# Patient Record
Sex: Female | Born: 1940 | Race: White | Hispanic: No | Marital: Married | Smoking: Never smoker
Health system: Southern US, Community
[De-identification: ages and names within clinical notes are randomized; demographics above are authoritative.]

## PROBLEM LIST (undated history)

## (undated) DIAGNOSIS — B029 Zoster without complications: Secondary | ICD-10-CM

## (undated) DIAGNOSIS — R1031 Right lower quadrant pain: Principal | ICD-10-CM

## (undated) DIAGNOSIS — D509 Iron deficiency anemia, unspecified: Secondary | ICD-10-CM

## (undated) DIAGNOSIS — D649 Anemia, unspecified: Secondary | ICD-10-CM

## (undated) DIAGNOSIS — F419 Anxiety disorder, unspecified: Secondary | ICD-10-CM

## (undated) DIAGNOSIS — G629 Polyneuropathy, unspecified: Secondary | ICD-10-CM

## (undated) DIAGNOSIS — M810 Age-related osteoporosis without current pathological fracture: Secondary | ICD-10-CM

## (undated) DIAGNOSIS — J40 Bronchitis, not specified as acute or chronic: Secondary | ICD-10-CM

## (undated) DIAGNOSIS — R269 Unspecified abnormalities of gait and mobility: Secondary | ICD-10-CM

## (undated) DIAGNOSIS — H8110 Benign paroxysmal vertigo, unspecified ear: Secondary | ICD-10-CM

## (undated) DIAGNOSIS — J189 Pneumonia, unspecified organism: Secondary | ICD-10-CM

## (undated) DIAGNOSIS — S92909A Unspecified fracture of unspecified foot, initial encounter for closed fracture: Secondary | ICD-10-CM

## (undated) DIAGNOSIS — I10 Essential (primary) hypertension: Secondary | ICD-10-CM

## (undated) DIAGNOSIS — K259 Gastric ulcer, unspecified as acute or chronic, without hemorrhage or perforation: Secondary | ICD-10-CM

## (undated) DIAGNOSIS — E785 Hyperlipidemia, unspecified: Secondary | ICD-10-CM

## (undated) DIAGNOSIS — G600 Hereditary motor and sensory neuropathy: Secondary | ICD-10-CM

## (undated) HISTORY — DX: Iron deficiency anemia, unspecified: D50.9

## (undated) HISTORY — DX: Age-related osteoporosis without current pathological fracture: M81.0

## (undated) HISTORY — DX: Anemia, unspecified: D64.9

## (undated) HISTORY — DX: Benign paroxysmal vertigo, unspecified ear: H81.10

## (undated) HISTORY — PX: BILATERAL OOPHORECTOMY: SHX1221

## (undated) HISTORY — PX: MASTOIDECTOMY: SHX711

## (undated) HISTORY — PX: CATARACT EXTRACTION: SUR2

## (undated) HISTORY — PX: OTHER SURGICAL HISTORY: SHX169

## (undated) HISTORY — DX: Bronchitis, not specified as acute or chronic: J40

## (undated) HISTORY — DX: Hereditary motor and sensory neuropathy: G60.0

## (undated) HISTORY — DX: Zoster without complications: B02.9

## (undated) HISTORY — DX: Right lower quadrant pain: R10.31

## (undated) HISTORY — DX: Unspecified abnormalities of gait and mobility: R26.9

## (undated) HISTORY — DX: Pneumonia, unspecified organism: J18.9

## (undated) HISTORY — DX: Polyneuropathy, unspecified: G62.9

## (undated) HISTORY — DX: Hyperlipidemia, unspecified: E78.5

## (undated) HISTORY — DX: Unspecified fracture of unspecified foot, initial encounter for closed fracture: S92.909A

## (undated) HISTORY — DX: Gastric ulcer, unspecified as acute or chronic, without hemorrhage or perforation: K25.9

## (undated) HISTORY — DX: Essential (primary) hypertension: I10

## (undated) HISTORY — PX: CHOLECYSTECTOMY: SHX55

## (undated) HISTORY — PX: COLONOSCOPY: SHX174

---

## 1977-07-13 HISTORY — PX: ABDOMINAL HYSTERECTOMY: SHX81

## 1994-07-13 HISTORY — PX: TOTAL ABDOMINAL HYSTERECTOMY W/ BILATERAL SALPINGOOPHORECTOMY: SHX83

## 1999-06-12 ENCOUNTER — Other Ambulatory Visit: Admission: RE | Admit: 1999-06-12 | Discharge: 1999-06-12 | Payer: Self-pay | Admitting: Obstetrics and Gynecology

## 2000-05-26 ENCOUNTER — Encounter: Admission: RE | Admit: 2000-05-26 | Discharge: 2000-05-26 | Payer: Self-pay | Admitting: Obstetrics and Gynecology

## 2000-05-26 ENCOUNTER — Encounter: Payer: Self-pay | Admitting: Obstetrics and Gynecology

## 2000-06-22 ENCOUNTER — Other Ambulatory Visit: Admission: RE | Admit: 2000-06-22 | Discharge: 2000-06-22 | Payer: Self-pay | Admitting: Obstetrics and Gynecology

## 2000-09-18 ENCOUNTER — Encounter: Payer: Self-pay | Admitting: Internal Medicine

## 2000-09-18 ENCOUNTER — Ambulatory Visit (HOSPITAL_COMMUNITY): Admission: RE | Admit: 2000-09-18 | Discharge: 2000-09-18 | Payer: Self-pay | Admitting: Internal Medicine

## 2001-04-28 ENCOUNTER — Encounter: Admission: RE | Admit: 2001-04-28 | Discharge: 2001-06-14 | Payer: Self-pay | Admitting: Orthopedic Surgery

## 2001-06-08 ENCOUNTER — Encounter: Payer: Self-pay | Admitting: Obstetrics and Gynecology

## 2001-06-08 ENCOUNTER — Encounter: Admission: RE | Admit: 2001-06-08 | Discharge: 2001-06-08 | Payer: Self-pay | Admitting: Obstetrics and Gynecology

## 2001-06-08 ENCOUNTER — Encounter: Payer: Self-pay | Admitting: Orthopedic Surgery

## 2001-08-09 ENCOUNTER — Other Ambulatory Visit: Admission: RE | Admit: 2001-08-09 | Discharge: 2001-08-09 | Payer: Self-pay | Admitting: Obstetrics and Gynecology

## 2001-09-14 ENCOUNTER — Encounter: Admission: RE | Admit: 2001-09-14 | Discharge: 2001-10-14 | Payer: Self-pay | Admitting: Internal Medicine

## 2002-01-18 ENCOUNTER — Ambulatory Visit (HOSPITAL_COMMUNITY): Admission: RE | Admit: 2002-01-18 | Discharge: 2002-01-18 | Payer: Self-pay | Admitting: Internal Medicine

## 2002-03-27 ENCOUNTER — Ambulatory Visit (HOSPITAL_COMMUNITY): Admission: RE | Admit: 2002-03-27 | Discharge: 2002-03-27 | Payer: Self-pay | Admitting: Obstetrics and Gynecology

## 2002-03-27 ENCOUNTER — Encounter: Payer: Self-pay | Admitting: Obstetrics and Gynecology

## 2002-04-06 ENCOUNTER — Ambulatory Visit (HOSPITAL_COMMUNITY): Admission: RE | Admit: 2002-04-06 | Discharge: 2002-04-06 | Payer: Self-pay | Admitting: Internal Medicine

## 2002-04-06 ENCOUNTER — Encounter: Payer: Self-pay | Admitting: Internal Medicine

## 2002-04-19 ENCOUNTER — Ambulatory Visit: Admission: RE | Admit: 2002-04-19 | Discharge: 2002-04-19 | Payer: Self-pay | Admitting: Gynecology

## 2002-05-02 ENCOUNTER — Encounter (INDEPENDENT_AMBULATORY_CARE_PROVIDER_SITE_OTHER): Payer: Self-pay | Admitting: Specialist

## 2002-05-02 ENCOUNTER — Ambulatory Visit (HOSPITAL_COMMUNITY): Admission: RE | Admit: 2002-05-02 | Discharge: 2002-05-02 | Payer: Self-pay | Admitting: Gynecology

## 2002-05-02 ENCOUNTER — Encounter: Payer: Self-pay | Admitting: Gynecology

## 2002-06-09 ENCOUNTER — Encounter: Admission: RE | Admit: 2002-06-09 | Discharge: 2002-06-09 | Payer: Self-pay | Admitting: Obstetrics and Gynecology

## 2002-06-09 ENCOUNTER — Encounter: Payer: Self-pay | Admitting: Obstetrics and Gynecology

## 2002-06-15 ENCOUNTER — Encounter: Payer: Self-pay | Admitting: Obstetrics and Gynecology

## 2002-06-15 ENCOUNTER — Encounter: Admission: RE | Admit: 2002-06-15 | Discharge: 2002-06-15 | Payer: Self-pay | Admitting: Obstetrics and Gynecology

## 2002-08-17 ENCOUNTER — Encounter: Payer: Self-pay | Admitting: Gynecology

## 2002-08-17 ENCOUNTER — Ambulatory Visit (HOSPITAL_COMMUNITY): Admission: RE | Admit: 2002-08-17 | Discharge: 2002-08-17 | Payer: Self-pay | Admitting: Gynecology

## 2002-08-22 ENCOUNTER — Ambulatory Visit: Admission: RE | Admit: 2002-08-22 | Discharge: 2002-08-22 | Payer: Self-pay | Admitting: Gynecology

## 2002-08-22 ENCOUNTER — Other Ambulatory Visit: Admission: RE | Admit: 2002-08-22 | Discharge: 2002-08-22 | Payer: Self-pay | Admitting: Gynecology

## 2002-10-19 ENCOUNTER — Ambulatory Visit (HOSPITAL_COMMUNITY): Admission: RE | Admit: 2002-10-19 | Discharge: 2002-10-19 | Payer: Self-pay | Admitting: Internal Medicine

## 2002-10-24 ENCOUNTER — Ambulatory Visit (HOSPITAL_COMMUNITY): Admission: RE | Admit: 2002-10-24 | Discharge: 2002-10-24 | Payer: Self-pay | Admitting: Internal Medicine

## 2002-11-13 ENCOUNTER — Encounter (HOSPITAL_COMMUNITY): Admission: RE | Admit: 2002-11-13 | Discharge: 2002-12-13 | Payer: Self-pay | Admitting: Oncology

## 2002-11-13 ENCOUNTER — Encounter: Admission: RE | Admit: 2002-11-13 | Discharge: 2002-11-13 | Payer: Self-pay | Admitting: Oncology

## 2002-12-06 ENCOUNTER — Encounter: Payer: Self-pay | Admitting: Internal Medicine

## 2002-12-06 ENCOUNTER — Ambulatory Visit (HOSPITAL_COMMUNITY): Admission: RE | Admit: 2002-12-06 | Discharge: 2002-12-06 | Payer: Self-pay | Admitting: Internal Medicine

## 2002-12-19 ENCOUNTER — Encounter: Payer: Self-pay | Admitting: Internal Medicine

## 2002-12-19 ENCOUNTER — Ambulatory Visit (HOSPITAL_COMMUNITY): Admission: RE | Admit: 2002-12-19 | Discharge: 2002-12-19 | Payer: Self-pay | Admitting: Internal Medicine

## 2002-12-27 ENCOUNTER — Encounter (HOSPITAL_COMMUNITY): Admission: RE | Admit: 2002-12-27 | Discharge: 2003-01-26 | Payer: Self-pay | Admitting: Oncology

## 2002-12-27 ENCOUNTER — Encounter: Admission: RE | Admit: 2002-12-27 | Discharge: 2002-12-27 | Payer: Self-pay | Admitting: Oncology

## 2003-01-09 ENCOUNTER — Ambulatory Visit (HOSPITAL_COMMUNITY): Admission: RE | Admit: 2003-01-09 | Discharge: 2003-01-09 | Payer: Self-pay | Admitting: Internal Medicine

## 2003-02-14 ENCOUNTER — Encounter (HOSPITAL_COMMUNITY): Admission: RE | Admit: 2003-02-14 | Discharge: 2003-03-16 | Payer: Self-pay | Admitting: Oncology

## 2003-02-14 ENCOUNTER — Encounter: Admission: RE | Admit: 2003-02-14 | Discharge: 2003-02-14 | Payer: Self-pay | Admitting: Oncology

## 2003-02-17 ENCOUNTER — Inpatient Hospital Stay (HOSPITAL_COMMUNITY): Admission: AD | Admit: 2003-02-17 | Discharge: 2003-02-18 | Payer: Self-pay | Admitting: Neurology

## 2003-02-17 ENCOUNTER — Encounter: Payer: Self-pay | Admitting: Emergency Medicine

## 2003-02-18 ENCOUNTER — Encounter: Payer: Self-pay | Admitting: Neurology

## 2003-02-21 ENCOUNTER — Ambulatory Visit: Admission: RE | Admit: 2003-02-21 | Discharge: 2003-02-21 | Payer: Self-pay | Admitting: Gynecology

## 2003-02-27 ENCOUNTER — Ambulatory Visit (HOSPITAL_COMMUNITY): Admission: RE | Admit: 2003-02-27 | Discharge: 2003-02-27 | Payer: Self-pay | Admitting: Internal Medicine

## 2003-04-17 ENCOUNTER — Encounter: Admission: RE | Admit: 2003-04-17 | Discharge: 2003-04-17 | Payer: Self-pay | Admitting: Oncology

## 2003-04-20 ENCOUNTER — Ambulatory Visit (HOSPITAL_COMMUNITY): Admission: RE | Admit: 2003-04-20 | Discharge: 2003-04-20 | Payer: Self-pay | Admitting: Neurology

## 2003-05-18 ENCOUNTER — Encounter: Admission: RE | Admit: 2003-05-18 | Discharge: 2003-05-18 | Payer: Self-pay | Admitting: Oncology

## 2003-05-30 ENCOUNTER — Ambulatory Visit (HOSPITAL_COMMUNITY): Admission: RE | Admit: 2003-05-30 | Discharge: 2003-05-30 | Payer: Self-pay | Admitting: Internal Medicine

## 2003-08-03 ENCOUNTER — Encounter: Admission: RE | Admit: 2003-08-03 | Discharge: 2003-08-03 | Payer: Self-pay | Admitting: Internal Medicine

## 2003-08-15 ENCOUNTER — Other Ambulatory Visit: Admission: RE | Admit: 2003-08-15 | Discharge: 2003-08-15 | Payer: Self-pay | Admitting: Gynecology

## 2003-08-15 ENCOUNTER — Encounter (INDEPENDENT_AMBULATORY_CARE_PROVIDER_SITE_OTHER): Payer: Self-pay | Admitting: Specialist

## 2003-08-15 ENCOUNTER — Ambulatory Visit (HOSPITAL_COMMUNITY): Admission: RE | Admit: 2003-08-15 | Discharge: 2003-08-15 | Payer: Self-pay | Admitting: Gynecology

## 2003-08-27 ENCOUNTER — Encounter (HOSPITAL_COMMUNITY): Admission: RE | Admit: 2003-08-27 | Discharge: 2003-09-26 | Payer: Self-pay | Admitting: Oncology

## 2003-08-27 ENCOUNTER — Encounter: Admission: RE | Admit: 2003-08-27 | Discharge: 2003-08-27 | Payer: Self-pay | Admitting: Oncology

## 2004-02-12 ENCOUNTER — Encounter: Admission: RE | Admit: 2004-02-12 | Discharge: 2004-02-12 | Payer: Self-pay | Admitting: Oncology

## 2004-02-12 ENCOUNTER — Encounter (HOSPITAL_COMMUNITY): Admission: RE | Admit: 2004-02-12 | Discharge: 2004-03-13 | Payer: Self-pay | Admitting: Oncology

## 2004-04-25 ENCOUNTER — Encounter (INDEPENDENT_AMBULATORY_CARE_PROVIDER_SITE_OTHER): Payer: Self-pay | Admitting: Specialist

## 2004-04-25 ENCOUNTER — Ambulatory Visit (HOSPITAL_COMMUNITY): Admission: RE | Admit: 2004-04-25 | Discharge: 2004-04-25 | Payer: Self-pay | Admitting: Gynecology

## 2004-06-27 ENCOUNTER — Ambulatory Visit (HOSPITAL_COMMUNITY): Admission: RE | Admit: 2004-06-27 | Discharge: 2004-06-27 | Payer: Self-pay | Admitting: Otolaryngology

## 2004-08-05 ENCOUNTER — Encounter: Admission: RE | Admit: 2004-08-05 | Discharge: 2004-08-05 | Payer: Self-pay | Admitting: Gynecology

## 2004-08-13 ENCOUNTER — Encounter (HOSPITAL_COMMUNITY): Admission: RE | Admit: 2004-08-13 | Discharge: 2004-09-12 | Payer: Self-pay | Admitting: Oncology

## 2004-08-13 ENCOUNTER — Encounter: Admission: RE | Admit: 2004-08-13 | Discharge: 2004-08-13 | Payer: Self-pay | Admitting: Oncology

## 2004-08-22 ENCOUNTER — Ambulatory Visit (HOSPITAL_COMMUNITY): Payer: Self-pay | Admitting: Oncology

## 2004-09-03 ENCOUNTER — Ambulatory Visit (HOSPITAL_COMMUNITY): Admission: RE | Admit: 2004-09-03 | Discharge: 2004-09-03 | Payer: Self-pay | Admitting: Gynecology

## 2004-09-09 ENCOUNTER — Ambulatory Visit: Admission: RE | Admit: 2004-09-09 | Discharge: 2004-09-09 | Payer: Self-pay | Admitting: Gynecology

## 2005-04-13 ENCOUNTER — Ambulatory Visit (HOSPITAL_COMMUNITY): Payer: Self-pay | Admitting: Oncology

## 2005-04-20 ENCOUNTER — Encounter (HOSPITAL_COMMUNITY): Admission: RE | Admit: 2005-04-20 | Discharge: 2005-05-20 | Payer: Self-pay | Admitting: Oncology

## 2005-04-20 ENCOUNTER — Encounter: Admission: RE | Admit: 2005-04-20 | Discharge: 2005-04-20 | Payer: Self-pay | Admitting: Oncology

## 2005-08-11 ENCOUNTER — Encounter: Admission: RE | Admit: 2005-08-11 | Discharge: 2005-08-11 | Payer: Self-pay | Admitting: Internal Medicine

## 2005-08-24 ENCOUNTER — Encounter: Admission: RE | Admit: 2005-08-24 | Discharge: 2005-08-24 | Payer: Self-pay | Admitting: Internal Medicine

## 2005-08-28 ENCOUNTER — Encounter: Admission: RE | Admit: 2005-08-28 | Discharge: 2005-08-28 | Payer: Self-pay | Admitting: Internal Medicine

## 2005-08-28 ENCOUNTER — Encounter (INDEPENDENT_AMBULATORY_CARE_PROVIDER_SITE_OTHER): Payer: Self-pay | Admitting: *Deleted

## 2005-12-01 ENCOUNTER — Encounter (INDEPENDENT_AMBULATORY_CARE_PROVIDER_SITE_OTHER): Payer: Self-pay | Admitting: *Deleted

## 2005-12-01 ENCOUNTER — Ambulatory Visit (HOSPITAL_COMMUNITY): Admission: RE | Admit: 2005-12-01 | Discharge: 2005-12-01 | Payer: Self-pay | Admitting: Gynecology

## 2006-01-06 ENCOUNTER — Ambulatory Visit: Admission: RE | Admit: 2006-01-06 | Discharge: 2006-01-06 | Payer: Self-pay | Admitting: Gynecology

## 2006-02-16 ENCOUNTER — Encounter: Admission: RE | Admit: 2006-02-16 | Discharge: 2006-02-16 | Payer: Self-pay | Admitting: Internal Medicine

## 2006-02-17 ENCOUNTER — Ambulatory Visit (HOSPITAL_COMMUNITY): Payer: Self-pay | Admitting: Oncology

## 2006-02-17 ENCOUNTER — Encounter (HOSPITAL_COMMUNITY): Admission: RE | Admit: 2006-02-17 | Discharge: 2006-03-19 | Payer: Self-pay | Admitting: Oncology

## 2006-02-17 ENCOUNTER — Encounter: Admission: RE | Admit: 2006-02-17 | Discharge: 2006-02-17 | Payer: Self-pay | Admitting: Oncology

## 2006-06-11 HISTORY — PX: FOOT SURGERY: SHX648

## 2006-08-12 ENCOUNTER — Encounter: Admission: RE | Admit: 2006-08-12 | Discharge: 2006-08-12 | Payer: Self-pay | Admitting: Internal Medicine

## 2006-10-21 ENCOUNTER — Ambulatory Visit (HOSPITAL_COMMUNITY): Admission: RE | Admit: 2006-10-21 | Discharge: 2006-10-21 | Payer: Self-pay | Admitting: Gynecology

## 2007-02-15 ENCOUNTER — Ambulatory Visit: Admission: RE | Admit: 2007-02-15 | Discharge: 2007-02-15 | Payer: Self-pay | Admitting: Gynecology

## 2007-02-16 ENCOUNTER — Ambulatory Visit (HOSPITAL_COMMUNITY): Payer: Self-pay | Admitting: Oncology

## 2007-02-16 ENCOUNTER — Encounter (HOSPITAL_COMMUNITY): Admission: RE | Admit: 2007-02-16 | Discharge: 2007-03-18 | Payer: Self-pay | Admitting: Oncology

## 2007-08-15 ENCOUNTER — Encounter: Admission: RE | Admit: 2007-08-15 | Discharge: 2007-08-15 | Payer: Self-pay | Admitting: Internal Medicine

## 2007-08-18 ENCOUNTER — Ambulatory Visit (HOSPITAL_COMMUNITY): Payer: Self-pay | Admitting: Oncology

## 2007-08-18 ENCOUNTER — Encounter (HOSPITAL_COMMUNITY): Admission: RE | Admit: 2007-08-18 | Discharge: 2007-09-17 | Payer: Self-pay | Admitting: Oncology

## 2007-11-29 ENCOUNTER — Ambulatory Visit (HOSPITAL_COMMUNITY): Admission: RE | Admit: 2007-11-29 | Discharge: 2007-11-29 | Payer: Self-pay | Admitting: Gynecology

## 2007-12-30 ENCOUNTER — Ambulatory Visit: Admission: RE | Admit: 2007-12-30 | Discharge: 2007-12-30 | Payer: Self-pay | Admitting: Gynecology

## 2008-03-20 ENCOUNTER — Encounter (HOSPITAL_COMMUNITY): Admission: RE | Admit: 2008-03-20 | Discharge: 2008-04-09 | Payer: Self-pay | Admitting: Oncology

## 2008-03-20 ENCOUNTER — Ambulatory Visit (HOSPITAL_COMMUNITY): Payer: Self-pay | Admitting: Oncology

## 2008-03-21 ENCOUNTER — Ambulatory Visit (HOSPITAL_COMMUNITY): Payer: Self-pay | Admitting: Oncology

## 2008-05-23 ENCOUNTER — Ambulatory Visit (HOSPITAL_COMMUNITY): Payer: Self-pay | Admitting: Oncology

## 2008-05-23 ENCOUNTER — Encounter (HOSPITAL_COMMUNITY): Admission: RE | Admit: 2008-05-23 | Discharge: 2008-06-22 | Payer: Self-pay | Admitting: Oncology

## 2008-08-22 ENCOUNTER — Emergency Department (HOSPITAL_COMMUNITY): Admission: EM | Admit: 2008-08-22 | Discharge: 2008-08-23 | Payer: Self-pay | Admitting: Emergency Medicine

## 2008-11-01 ENCOUNTER — Encounter: Admission: RE | Admit: 2008-11-01 | Discharge: 2008-11-01 | Payer: Self-pay | Admitting: Internal Medicine

## 2009-01-02 ENCOUNTER — Encounter (INDEPENDENT_AMBULATORY_CARE_PROVIDER_SITE_OTHER): Payer: Self-pay | Admitting: Interventional Radiology

## 2009-01-02 ENCOUNTER — Ambulatory Visit (HOSPITAL_COMMUNITY): Admission: RE | Admit: 2009-01-02 | Discharge: 2009-01-02 | Payer: Self-pay | Admitting: Gynecology

## 2009-01-04 ENCOUNTER — Ambulatory Visit: Admission: RE | Admit: 2009-01-04 | Discharge: 2009-01-04 | Payer: Self-pay | Admitting: Gynecology

## 2009-01-24 ENCOUNTER — Encounter (HOSPITAL_COMMUNITY): Admission: RE | Admit: 2009-01-24 | Discharge: 2009-02-23 | Payer: Self-pay | Admitting: Oncology

## 2009-01-24 ENCOUNTER — Ambulatory Visit (HOSPITAL_COMMUNITY): Payer: Self-pay | Admitting: Oncology

## 2009-02-11 ENCOUNTER — Ambulatory Visit: Payer: Self-pay | Admitting: Internal Medicine

## 2009-02-11 DIAGNOSIS — R112 Nausea with vomiting, unspecified: Secondary | ICD-10-CM | POA: Insufficient documentation

## 2009-02-11 DIAGNOSIS — R109 Unspecified abdominal pain: Secondary | ICD-10-CM | POA: Insufficient documentation

## 2009-02-11 DIAGNOSIS — R197 Diarrhea, unspecified: Secondary | ICD-10-CM | POA: Insufficient documentation

## 2009-02-13 ENCOUNTER — Encounter: Payer: Self-pay | Admitting: Internal Medicine

## 2009-02-18 ENCOUNTER — Telehealth (INDEPENDENT_AMBULATORY_CARE_PROVIDER_SITE_OTHER): Payer: Self-pay

## 2009-04-23 ENCOUNTER — Ambulatory Visit: Payer: Self-pay | Admitting: Internal Medicine

## 2009-04-25 DIAGNOSIS — K219 Gastro-esophageal reflux disease without esophagitis: Secondary | ICD-10-CM | POA: Insufficient documentation

## 2009-09-05 ENCOUNTER — Ambulatory Visit (HOSPITAL_COMMUNITY): Admission: RE | Admit: 2009-09-05 | Discharge: 2009-09-05 | Payer: Self-pay | Admitting: Ophthalmology

## 2009-09-12 ENCOUNTER — Ambulatory Visit (HOSPITAL_COMMUNITY): Admission: RE | Admit: 2009-09-12 | Discharge: 2009-09-12 | Payer: Self-pay | Admitting: Ophthalmology

## 2009-10-17 ENCOUNTER — Ambulatory Visit: Payer: Self-pay | Admitting: Internal Medicine

## 2009-10-18 DIAGNOSIS — Z862 Personal history of diseases of the blood and blood-forming organs and certain disorders involving the immune mechanism: Secondary | ICD-10-CM

## 2009-10-24 ENCOUNTER — Ambulatory Visit: Payer: Self-pay | Admitting: Internal Medicine

## 2009-11-05 ENCOUNTER — Encounter: Admission: RE | Admit: 2009-11-05 | Discharge: 2009-11-05 | Payer: Self-pay | Admitting: Internal Medicine

## 2009-11-11 ENCOUNTER — Encounter: Admission: RE | Admit: 2009-11-11 | Discharge: 2009-11-11 | Payer: Self-pay | Admitting: Internal Medicine

## 2010-01-17 ENCOUNTER — Ambulatory Visit (HOSPITAL_COMMUNITY): Admission: RE | Admit: 2010-01-17 | Discharge: 2010-01-17 | Payer: Self-pay | Admitting: Internal Medicine

## 2010-01-23 ENCOUNTER — Ambulatory Visit (HOSPITAL_COMMUNITY): Payer: Self-pay | Admitting: Oncology

## 2010-01-23 ENCOUNTER — Encounter (HOSPITAL_COMMUNITY): Admission: RE | Admit: 2010-01-23 | Discharge: 2010-02-22 | Payer: Self-pay | Admitting: Oncology

## 2010-03-11 ENCOUNTER — Encounter: Payer: Self-pay | Admitting: Internal Medicine

## 2010-05-26 ENCOUNTER — Encounter: Admission: RE | Admit: 2010-05-26 | Discharge: 2010-05-26 | Payer: Self-pay | Admitting: Internal Medicine

## 2010-08-03 ENCOUNTER — Encounter: Payer: Self-pay | Admitting: Internal Medicine

## 2010-08-03 ENCOUNTER — Encounter: Payer: Self-pay | Admitting: Gynecology

## 2010-08-12 NOTE — Assessment & Plan Note (Signed)
Summary: FU OV 6 MO,GERD,ABD PAIN/AMS   Visit Type:  Follow-up Visit Primary Care Provider:  fagan  Chief Complaint:  follow up- doing ok.  History of Present Illness: Followupl abdominal pain; history of iron deficiency anemia.  patient has done very wel;l gained 2 pounds in 6 months. One episode of abdominal pain within the past 6 months; otherwise she's done fine. No apparent GI bleeding or other symptoms. She has Benadryl and Phenergan on hand for p.r.n. use but hasn't really been using them. She was recently treated for bronchitis with antibiotics by Dr. Ouida Sills. She is due for routine screening colonoscopy 2014  Current Problems (verified): 1)  Gerd  (ICD-530.81) 2)  Diarrhea, Recurrent  (ICD-787.91) 3)  Nausea With Vomiting  (ICD-787.01) 4)  Abdominal Cramps  (ICD-789.00)  Current Medications (verified): 1)  Lyrica 100 Mg Caps (Pregabalin) .... Take 1 Tablet By Mouth Three Times A Day 2)  Actonel 150 Mg Tabs (Risedronate Sodium) .... Once Monthly 3)  Exforge 5-160 Mg Tabs (Amlodipine Besylate-Valsartan) .... Take 1 Tablet By Mouth Once A Day 4)  Estradiol 0.5 Mg Tabs (Estradiol) .... Take 1 Tablet By Mouth Once A Day 5)  Klonopin 0.5 Mg Tabs (Clonazepam) .... Take 1 Tablet By Mouth Once A Day 6)  Ambien 10 Mg Tabs (Zolpidem Tartrate) .... As Needed 7)  Otc Iron .... Three Tablets Weekly 8)  Aspirin 81 Mg Tbec (Aspirin) .... One By Mouth Daily 9)  Promethazine Hcl 25 Mg Tabs (Promethazine Hcl) .... One By Mouth Every 4-6 Hours As Needed N/v 10)  Dicyclomine Hcl 10 Mg Caps (Dicyclomine Hcl) .... One By Mouth Qid As Needed Abd Cramps or Diarrhea  Allergies (verified): 1)  ! Cipro 2)  ! Sulfa  Past History:  Past Medical History: Last updated: 02/11/2009 Stroke Anemia, iron def in the past, now corrected.  Followed by Dr. Mariel Sleet Peripheral neuropathy with diagnosis of Charcot-Marie-Tooth followed by Dr. Avie Echevaria Recurrent posterior pelvic inclusion cyst since 2003,  requires periodic drainage.  Fluid negative or malignancy.           Recent core biopsy negative in 6/10. Hypertension Osteoporosis EGD/TCS negative in 2004, done for IDA.  Givens capsule X 2 showed abnormalities in jejunum including pale appearing mucosa and fibrotic and stenotic areas.  EGD 11/04 with small bowel bx negative although the jejunum did appear somewhat friable and swollen.  Previous negative celiac markers and Promethius IBD panel EGD 4/04, small prepyloric antral ulcer, treated for HPylori and Celebrex held.  Past Surgical History: Last updated: 02/11/2009 right foot surgery, Charcot joint Appendectomy Cholecystectomy Hysterectomy Tonsillectomy Breast Biopsy, benigh Recurrent posterior pelvic inclusion cyst requiring multiple drainages and alcohol infusion since 2003.  Most recent exam 6/10, cyst did not recur but core bx done for solid appearance and was negative.    Family History: Last updated: 02/11/2009 Unknown, patient adopted.  Social History: Last updated: 02/11/2009 Patient has never smoked.  Alcohol Use - no Illicit Drug Use - no  Risk Factors: Smoking Status: never (02/11/2009)  Vital Signs:  Patient profile:   70 year old female Height:      67 inches Weight:      152 pounds BMI:     23.89 Temp:     97.6 degrees F oral Pulse rate:   76 / minute BP sitting:   142 / 90  (left arm) Cuff size:   regular  Vitals Entered By: Hendricks Limes LPN (October 17, 1189 8:38 AM)  Physical Exam  General:  looks very well today Eyes:  no scleral icterus ; conjunctiva pink Lungs:  clear to auscultation Heart:  regular rate rhythm without murmur gallop rub Abdomen:  nondistended positive bowel sounds soft, nontender without mass/organomegaly  Impression & Recommendations: Impression: History of rare intermittent abdominal pain. Overall doing very well this time. Was giving some consideration to CT angiogram to look for mesenteric ischemia but no need to do that  at this point in time. Clinically, she is doing well from a GI standpoint.  Recommendations:send her home with one immunofecal occult blood test kit  Assuming above negative, we'll plan to see her back in one year. Screening colonoscopy 2014.  Appended Document: Orders Update    Clinical Lists Changes  Problems: Added new problem of ANEMIA, IRON DEFICIENCY, HX OF (ICD-V12.3) Orders: Added new Service order of Est. Patient Level III ([address]) - Signed      Appended Document: FU OV 6 MO,GERD,ABD PAIN/AMS reminder in computer

## 2010-08-12 NOTE — Assessment & Plan Note (Signed)
Summary: DROPPED OFF STOOL/SS   Pt returned one iFOB and it was positive.    Allergies: 1)  ! Cipro 2)  ! Sulfa  Other Orders: Immuno-chemical Fecal Occult (91478)

## 2010-08-15 NOTE — Letter (Signed)
Summary: [facility]  [facility]   Imported By: Rexene Alberts 03/11/2010 14:59:24  _____________________________________________________________________  External Attachment:    Type:   Image     Comment:   External Document

## 2010-10-01 LAB — HEMOGLOBIN AND HEMATOCRIT, BLOOD
HCT: 37.6 % (ref 36.0–46.0)
Hemoglobin: 12.9 g/dL (ref 12.0–15.0)

## 2010-10-01 LAB — BASIC METABOLIC PANEL
Calcium: 9.5 mg/dL (ref 8.4–10.5)
GFR calc Af Amer: >60 mL/min (ref >60)
GFR calc non Af Amer: >60 mL/min (ref >60)
Potassium: 3.6 mEq/L (ref 3.5–5.1)
Sodium: 139 mEq/L (ref 135–145)

## 2010-10-19 LAB — LIPID PANEL
Cholesterol: 226 mg/dL — ABNORMAL HIGH (ref 0–200)
LDL Cholesterol: 118 mg/dL — ABNORMAL HIGH (ref 0–99)
Triglycerides: 282 mg/dL — ABNORMAL HIGH (ref <150)

## 2010-10-19 LAB — CBC
Platelets: 318 10*3/uL (ref 150–400)
RBC: 4.3 MIL/uL (ref 3.87–5.11)
WBC: 7 10*3/uL (ref 4.0–10.5)

## 2010-10-19 LAB — FERRITIN: Ferritin: 56 ng/mL (ref 10–291)

## 2010-10-20 LAB — CBC
HCT: 36.7 % (ref 36.0–46.0)
MCHC: 34.1 g/dL (ref 30.0–36.0)
MCV: 83.3 fL (ref 78.0–100.0)
Platelets: 380 10*3/uL (ref 150–400)
RDW: 13.1 % (ref 11.5–15.5)
WBC: 6.8 10*3/uL (ref 4.0–10.5)

## 2010-10-27 ENCOUNTER — Other Ambulatory Visit: Payer: Self-pay | Admitting: Internal Medicine

## 2010-10-27 DIAGNOSIS — Z09 Encounter for follow-up examination after completed treatment for conditions other than malignant neoplasm: Secondary | ICD-10-CM

## 2010-10-28 LAB — COMPREHENSIVE METABOLIC PANEL
Albumin: 4.3 g/dL (ref 3.5–5.2)
BUN: 17 mg/dL (ref 6–23)
Chloride: 98 mEq/L (ref 96–112)
Creatinine, Ser: 1.03 mg/dL (ref 0.4–1.2)
GFR calc non Af Amer: 53 mL/min — ABNORMAL LOW (ref >60)
Total Bilirubin: 1.1 mg/dL (ref 0.3–1.2)

## 2010-10-28 LAB — BLOOD GAS, ARTERIAL
O2 Content: 21 L/min
pH, Arterial: 7.304 — ABNORMAL LOW (ref 7.350–7.400)

## 2010-10-28 LAB — DIFFERENTIAL
Basophils Absolute: 0.1 10*3/uL (ref 0.0–0.1)
Lymphocytes Relative: 9 % — ABNORMAL LOW (ref 12–46)
Monocytes Absolute: 0.2 10*3/uL (ref 0.1–1.0)
Neutro Abs: 15 10*3/uL — ABNORMAL HIGH (ref 1.7–7.7)

## 2010-10-28 LAB — LIPASE, BLOOD: Lipase: 30 U/L (ref 11–59)

## 2010-10-28 LAB — BASIC METABOLIC PANEL
Calcium: 8.8 mg/dL (ref 8.4–10.5)
Creatinine, Ser: 0.78 mg/dL (ref 0.4–1.2)
GFR calc Af Amer: >60 mL/min (ref >60)

## 2010-10-28 LAB — LACTIC ACID, PLASMA: Lactic Acid, Venous: 7.9 mmol/L — ABNORMAL HIGH (ref 0.5–2.2)

## 2010-10-28 LAB — CBC
HCT: 44.2 % (ref 36.0–46.0)
MCHC: 34 g/dL (ref 30.0–36.0)
MCV: 84.1 fL (ref 78.0–100.0)
Platelets: 444 10*3/uL — ABNORMAL HIGH (ref 150–400)
WBC: 16.9 10*3/uL — ABNORMAL HIGH (ref 4.0–10.5)

## 2010-11-25 ENCOUNTER — Ambulatory Visit
Admission: RE | Admit: 2010-11-25 | Discharge: 2010-11-25 | Disposition: A | Discharge status: Home / Self Care | Payer: Commercial Indemnity | Source: Ambulatory Visit | Attending: Internal Medicine | Admitting: Internal Medicine

## 2010-11-25 DIAGNOSIS — Z09 Encounter for follow-up examination after completed treatment for conditions other than malignant neoplasm: Secondary | ICD-10-CM

## 2010-11-25 NOTE — Consult Note (Signed)
NAMEMOLLEIGH, HUOT               ACCOUNT NO.:  [id number]   MEDICAL RECORD NO.:  [id number]          PATIENT TYPE:  OUT   LOCATION:  GYN                          FACILITY:  [facility]   PHYSICIAN:  De Blanch, M.D.DATE OF BIRTH:  12-Mar-1941   DATE OF CONSULTATION:  02/15/2007  DATE OF DISCHARGE:                                 CONSULTATION   CHIEF COMPLAINT:  Persistent pelvic cysts.   INTERVAL HISTORY:  The patient returns today for annual checkup.  She  had a recurrence of her pelvic cyst in April.  This was drained  percutaneously by CT guidance and contained 325 mL of clear colorless  fluid similar to the amount drained a year previously.  Since then, the  patient has done well.  She denies any pelvic pain, pressure, vaginal  bleeding, or discharge.  Functional status is excellent.   HISTORY OF PRESENT ILLNESS:  The patient is found to have a  retroperitoneal inclusion cyst, undergoing initial attempt at surgical  resection.  However, she has had recurrences and, given the location of  these cysts, it was my impression and opinion that resection would again  be unlikely to be totally successful.  Therefore, these have been  drained percutaneously with CT guidance on the average of about once a  year.   PAST MEDICAL HISTORY:   MEDICAL ILLNESSES:  1. Prepyloric ulcer.  2. Peripheral neuropathy.  3. Charcot joint, left foot.  4. Menstrual headaches.   PAST SURGICAL HISTORY:  1. TAH-BSO.  2. Exploratory laparotomy with residual ovary 2003.  3. Cholecystectomy.  4. Tonsils and adenoidectomy.  5. Breast biopsy and cyst removal.   DRUG ALLERGIES:  SULFA and CIPRO.   CURRENT MEDICATIONS:  Antihistamine, antihypertensive, Lyrica, Premarin  0.9 mg.   REVIEW OF SYSTEMS:  Is negative except as noted above.  It is noted the  patient has previously attempted to discontinue the use of hormone  replacement therapy, and she has had increasing headaches.  She is not  having any hot flashes.   PHYSICAL EXAMINATION:  VITAL SIGNS:  Weight 142 pounds.  Blood pressure  160/75.  GENERAL:  Patient is a healthy white female in no acute distress.  HEENT:  Negative.  NECK:  Supple without thyromegaly.  ADENOPATHY:  There is no supraclavicular or inguinal adenopathy.  ABDOMEN:  Soft, nontender.  No mass, organomegaly, ascites or hernias  noted.  PELVIC:  EG-BUS, vagina and urethra are normal.  Cervix and uterus  surgically absent.  Adnexa without masses.  Rectovaginal exam reveals  some fullness to the right lateral aspect of the rectum.  This is  nontender.   IMPRESSION:  Recurrent to retroperitoneal cyst, always amenable to  drainage on an annual basis.  I had a lengthy discussion with the  patient regarding other management options, and I believe it would be  reasonable to consider sclerosing this cyst the next time that it  requires drainage.  The patient is in agreement with this plan.   She has what sounds like menstrual headaches and is unable to withdraw  from hormone replacement therapy.  She is  desirous of taking a lower  dose of Premarin; therefore, she is given a prescription for Premarin  0.65 mg.  She will contact us if she has worsening of her headaches.  Certainly next year we could consider reducing her dose further.   She will return in 1 year or p.r.n.      De Blanch, M.D.  Electronically Signed     DC/MEDQ  D:  02/15/2007  T:  02/16/2007  Job:  [id number]   cc:   Ladona Horns. Mariel Sleet, MD  Fax: [phone]   R. Roetta Sessions, M.D.  [address]  [place]  [place] [address]   Kingsley Callander. Ouida Sills, MD  Fax: [phone]   Evie Lacks, MD  Fax: [phone]   Telford Nab, R.N.  [phone] [address]  [place], [place] [address]

## 2010-11-25 NOTE — Consult Note (Signed)
NAMEMEILING, HENDRIKS               ACCOUNT NO.:  [id number]   MEDICAL RECORD NO.:  [id number]          PATIENT TYPE:  OUT   LOCATION:  GYN                          FACILITY:  [facility]   PHYSICIAN:  De Blanch, M.D.DATE OF BIRTH:  1941-03-03   DATE OF CONSULTATION:  01/04/2009  DATE OF DISCHARGE:                                 CONSULTATION   CHIEF COMPLAINT:  Pelvic cyst.   INTERVAL HISTORY:  The patient returns today for continuing followup.  Shortly prior to her last visit, she had sclerosis of the pelvic cyst  using alcohol.  Recently, she developed some right lower quadrant  discomfort and presumed the cyst had recurred.  However, on skin, there  was an area measuring only 2.6 x 3.1 cm to the right posterior rectum.  This was subsequently biopsied using core biopsy and CT guidance,  revealing benign fragments of a benign cyst.   The patient's symptoms are very minimal and she is reassured by the fact  that the larger cyst had not recurred.   Otherwise, she is doing well.  She has no GI or GU symptoms.  Her  functional status is very good.   HISTORY OF PRESENT ILLNESS:  The patient had a large retroperitoneal  inclusion cyst, initially undergoing attempt at resection surgically,  which seemed to be complete.  However, postoperatively, she developed  recurrence of the cyst and, given its location and risk of further  surgery, we have drained it on several occasions using CT guided or  ultrasound guided drainage.  Frequency seemed to be approximately once a  year.  Cytology of the cyst has always been negative, and now a core  biopsy as noted above is negative.   PAST MEDICAL HISTORY/MEDICAL ILLNESSES:  Iron deficiency anemia.  Prepyloric ulcer.  Peripheral neuropathy.  Charcot joint left foot.   PAST SURGICAL HISTORY:  TAH/BSO.  Exploratory laparotomy with resection  of residual ovary 2003.  Cholecystectomy.  Tonsil and adenoidectomy.  Breast biopsy.   DRUG  ALLERGIES:  SULFA and CIPRO.   CURRENT MEDICATIONS:  1. Antihypertensive.  2. Lyrica.  3. Premarin 0.9 mg daily.   REVIEW OF SYSTEMS:  A 10-point comprehensive review of systems negative  except as noted above.   PHYSICAL EXAM:  VITAL SIGNS:  Weight 146 pounds, blood pressure 130/70.  GENERAL:  The patient is a healthy white female, in no acute distress.  HEENT:  Negative.  NECK:  Supple without thyromegaly.  There is no supraclavicular or  inguinal adenopathy.  ABDOMEN:  Soft and nontender.  No mass or organomegaly, ascites, or  hernia noted.  PELVIC:  EG, BUS, vagina, bladder, urethra normal.  Cervix and uterus  surgically absent.  Adnexa reveals some thickening in the right pelvis  without a discrete mass and certainly no cyst palpated.  Rectovaginal  exam confirms.   IMPRESSION:  Retroperitoneal cyst status post drainage on several  occasions, status post sclerosis with alcohol in May 2009.  The patient  has not had any recurrence since that time.  Because of some discomfort,  she had a core biopsy of the remaining  very small mass, which has  returned as a benign cystic tissue.   The patient is desirous of establishing care with a gynecologist, and we  would refer her to Dr. Christin Bach in [place], near her home.  Certainly, we would be happy to see her back again if a problem arose.      De Blanch, M.D.  Electronically Signed     DC/MEDQ  D:  01/04/2009  T:  01/04/2009  Job:  [id number]   cc:   Telford Nab, R.N.  [address]  [place], [place] [address]   Ladona Horns. Mariel Sleet, MD  Fax: [phone]   Kingsley Callander. Ouida Sills, MD  Fax: [phone]   Evie Lacks, MD  Fax: [phone]   Tilda Burrow, M.D.  Fax: [phone]

## 2010-11-25 NOTE — Consult Note (Signed)
NAMESARHA, [name]               ACCOUNT NO.:  [id number]   MEDICAL RECORD NO.:  [id number]          PATIENT TYPE:  OUT   LOCATION:  GYN                          FACILITY:  [facility]   PHYSICIAN:  De Blanch, M.D.DATE OF BIRTH:  1941-05-10   DATE OF CONSULTATION:  12/30/2007  DATE OF DISCHARGE:                                 CONSULTATION   CHIEF COMPLAINT:  Pelvic cyst, right lower quadrant pain.   INTERVAL HISTORY:  The patient returns today for continuing followup.  She underwent drainage of her recurrent pelvic cyst on May 19, and at  that time attempted sclerosis with alcohol.  The patient reports that  she had resolution of her symptoms for approximately 4 days, but then  has had recurring symptoms in the right lower quadrant extending toward  the sacrum.  She denies any fever or chills.   HISTORY OF PRESENT ILLNESS:  The patient had a retroperitoneal inclusion  cyst undergoing initial attempt at surgical resection.  She was followed  and, unfortunately, developed a recurrence.  She has subsequently had CT-  guided ultrasound-guided aspirations on an average of about once a year.  She has significant relief of symptoms when she had this performed.  Cytology has been negative.   PAST MEDICAL HISTORY/MEDICAL ILLNESSES:  Iron deficiency anemia,  prepyloric ulcer, peripheral neuropathy, Charcot joint left foot.   PAST SURGICAL HISTORY:  TAH-BSO.  Exploratory laparotomy with resection  of residual ovary in 2003, cholecystectomy, tonsillectomy and  adenoidectomy, breast biopsy with cyst removal.   DRUG ALLERGIES:  SULFA and CIPRO.   CURRENT MEDICATIONS:  1. An antihypertensive.  2. Lyrica.  3. Premarin 0.9 mg.   REVIEW OF SYSTEMS:  A 10-point comprehensive review of systems is  negative except as noted above.   PHYSICAL EXAM:  VITAL SIGNS:  Weight 145 pounds, blood pressure 110/70,  pulse 89, respiratory rate 20.  GENERAL:  The patient is a healthy white female  in no acute distress.  HEENT is negative.  NECK:  Supple without thyromegaly.  LYMPHATICS:  There is no supraclavicular or inguinal adenopathy.  ABDOMEN:  Soft and nontender.  Palpation of the right lower quadrant  does not elicit pain.  She does not have any CVA tenderness.  PELVIC:  EG, BUS, vagina, bladder urethra are normal.  No lesions are  noted in the vagina and the cuff is well-supported.  Bimanual exam  reveals some fullness on the posterior right vagina, and on rectovaginal  exam, a 4 to 5 cm a mass is palpated along the sacrum to the right.   IMPRESSION:  Recurrent retroperitoneal cyst.  I discussed this case with  Dr. Irish Lack, and we will plan on re-imaging the patient, as she  has a recurrent cyst, placing a suction drain to see if we can get this  cyst to occlude.  She will return to see me in approximately 1 year or  as needed.      De Blanch, M.D.  Electronically Signed     DC/MEDQ  D:  12/30/2007  T:  12/30/2007  Job:  [id number]   cc:  Telford Nab, R.N.  [address]  [place], [place] [address]   Ladona Horns. Mariel Sleet, MD  Fax: [phone]   R. Roetta Sessions, M.D.  [address]  [place]  [place] [address]   Kingsley Callander. Ouida Sills, MD  Fax: [phone]   Evie Lacks, MD  Fax: [phone]

## 2010-11-28 NOTE — Consult Note (Signed)
NAME:  [name], [name]                         ACCOUNT NO.:  [id number]   MEDICAL RECORD NO.:  [id number]                   PATIENT TYPE:  OUT   LOCATION:  GYN                                  FACILITY:  [facility]   PHYSICIAN:  De Blanch, M.D.         DATE OF BIRTH:  Apr 16, 1941   DATE OF CONSULTATION:  08/22/2002  DATE OF DISCHARGE:                                   CONSULTATION   HISTORY OF PRESENT ILLNESS:  A 70 year old white female returns for  continuing follow-up of a pelvic cyst.  She has previously undergone surgery  for this cyst in 1996, requiring an extensive retroperitoneal procedure.  Final pathology showed this to be benign ovarian tissue remnants with benign  serous cysts and foci of endometriosis.  The patient has followed  subsequently and done well, although in the summer of last year was found to  have a reoccurrence of the cyst.  The cyst was discovered at the time of the  evaluation for right lower quadrant and flank pain and at that time measured  4.1 x 6.3 x 5.4 cm.  It had thin septations.  Ultimately we managed it by CT-  directed aspiration.  Cytology of that cyst fluid revealed glandular  epithelial cells with benign features.  There was no cytologic atypia.  Subsequently, the patient reports she has done reasonably well.  She  continues to have pain in the right side of the abdomen which is sometimes  relieved by Zantac.  In addition, sometimes she has pain in the left side of  the abdomen.  She denies any constipation or diarrhea or any change in bowel  habits.  Otherwise, she is doing well.   PHYSICAL EXAMINATION:  VITAL SIGNS:  Weight 148 [name], blood pressure  156/74.  GENERAL:  The patient is a healthy white female in no acute distress.  HEENT:  Negative.  NECK:  Supple without thyromegaly.  ABDOMEN:  Soft and nontender.  She has a well-healed midline incision.  There are no masses, organomegaly, or ascites noted.  PELVIC:  EG//BUS,  vagina, bladder, urethra are normal.  Cervix and uterus is  surgically absent.  On bimanual and rectovaginal exam, I can barely feel a  smooth cystic structure very high and slightly to the right of the upper  vagina and pararectal region.  This is nontender.  I believe this correlates  with the cyst seen on MRI.   LABORATORY DATA:  Laboratory results are reviewed.  The patient had a follow-  up MRI on August 17, 2002.  The liver lesion previously seen is now 1 cm  (smaller) on the right hepatic lobe and has enhancement characteristics  typical for a hepatic hemangioma.  In the pelvis, there is a reoccurrence of  the cyst, measuring 3 x 5.5 x 4.5 cm.  This minimally decreased in size  compared to the prior exam and has a volume of approximately 40 mL.  Morphology is similar to the prior exam, and there is no nodularity or solid  components.   IMPRESSION:  Probable peritoneal inclusion cyst to the right of the rectum.  Whether this is causing any of the patient's symptoms is difficult to  ascertain, and frankly I believe it is unlikely.  She has certainly had  multiple prior operations which have led to adhesions which are probably  more likely the cause of her pain.  The patient asks whether her pain might  be associated with a gastrointestinal problem, and I think it would be  reasonable to obtain gastrointestinal consultation.  It is noted the patient  has never had colonoscopy, and I believe she should have this for colon  cancer screening anyway.  She will arrange to have gastrointestinal medicine  evaluation.  With regard to  the cyst, I believe we should follow it at this juncture and would not plan  on any additional scanning unless she has worsening symptoms.  She will  return in six months for repeat pelvic examination.  A Pap smear is obtained  at the patient's request today.                                               De Blanch, M.D.    DC/MEDQ  D:   08/22/2002  T:  08/22/2002  Job:  [id number]   cc:   Malachi Pro. Ambrose Mantle, M.D.  [address]  [place]  [place] [address]  Fax: [phone]   Telford Nab, R.N.  [address] [place], [place] [address]  Fax: [phone]

## 2010-11-28 NOTE — Op Note (Signed)
NAME:  [name], [name]                         ACCOUNT NO.:  [id number]   MEDICAL RECORD NO.:  [id number]                   PATIENT TYPE:  AMB   LOCATION:  DAY                                  FACILITY:  [facility]   PHYSICIAN:  R. Roetta Sessions, M.D.              DATE OF BIRTH:  1940-10-29   DATE OF PROCEDURE:  05/30/2003  DATE OF DISCHARGE:                                 OPERATIVE REPORT   PROCEDURE:  Enteroscopy.   ENDOSCOPIST:  Gerrit Friends. Rourk, M.D.   INDICATIONS FOR PROCEDURE:  The patient is a 70 year old lady with well  documented iron-deficiency anemia.  EGD and colonoscopy negative previously.  Small-bowel follow through also negative, however, given capsule endoscopy  x2 revealed significant abnormalities in the jejunum including pale  appearing mucosa and fibrotic and stenotic areas.  The question of Crohn's  disease remains.  Her terminal ileum appeared normal at colonoscopy  previously.  Small-bowel enteroscopy is now being done to try and direct  visual her jejunum and at least get some biopsies of the abnormal area.  This approach has been discussed with the patient at the length.  The  potential risks, benefits, and alternatives have been reviewed.  The  potential for this procedure not giving [place] an answer was also fully  explained to [name].  Please see my handwritten H&P.   PROCEDURE NOTE:  O2 saturation, blood pressure, pulse and respirations were  monitored throughout the entire procedure.  Conscious sedation: Versed 4 mg IV, Demerol 75 mg IV in divided doses.  Cetacaine spray for topical oropharyngeal anesthesia.   INSTRUMENT:  Olympus video chip pediatric colonoscope.   FINDINGS:  Examination of the tubular esophagus revealed no mucosal  abnormalities.  The EG junction easily traversed.   STOMACH:  The gastric cavity was empty.  It insufflated well with air.  A  thorough examination of the gastric mucosa including a retroflex view of the  proximal stomach  and esophagogastric junction demonstrated no abnormalities.  The pylorus was patent and easily traversed.   Examination of the bulb and the second and third portion of the duodenum  revealed no abnormalities. I was able to advance the scope in a nice 1:1  fashion to approximately 120 cm which I felt was well into the jejunum.  I  was not able to go any further because of looping.  The jejunum mucosa did  appear to be somewhat swollen and friable; however, I did not encounter any  stenotic areas, erosions or ulcerations. I was able to see a good 15 cm in  front of me as well.  This was the case further downstream as far as I could  see.  I did take multiple biopsies of the jejunal mucosa.   The patient tolerated the procedure well and was reacted in endoscopy.   IMPRESSION:  1. Normal esophagus.  2. Normal stomach.  3. Normal D1, D2, and  D3.  4. Slightly swollen, friable jejunal mucosa as described above, biopsied     multiple times.   RECOMMENDATIONS:  1. Will follow up on path.  2. Further recommendations to follow.      ___________________________________________                                            Jonathon Bellows, M.D.   RMR/MEDQ  D:  05/30/2003  T:  05/31/2003  Job:  [id number]   cc:   Ladona Horns. Neijstrom, MD  [address]  [place]  [place] [address]  Fax: [phone]   De Blanch, M.D.   Kingsley Callander. Ouida Sills, M.D.  [address]  [place]  [place] [address]  Fax: [phone]

## 2010-11-28 NOTE — Consult Note (Signed)
NAME:  [name], [name]                         ACCOUNT NO.:  [id number]   MEDICAL RECORD NO.:  [id number]                   PATIENT TYPE:  OUT   LOCATION:  GYN                                  FACILITY:  [facility]   PHYSICIAN:  De Blanch, MD           DATE OF BIRTH:  1941/05/22   DATE OF CONSULTATION:  04/19/2002  DATE OF DISCHARGE:                                   CONSULTATION   A 70 year old white married female seen in consultation at the request of  Dr. Hilbert Bible.  The patient recently developed right lower quadrant and  flank pain and underwent an ultrasound and MRI showing a right pelvic cyst  measuring 4.1 x 6.3 x 5.4 cm.  It has small thin septations and there  appears to be a small amount of ovarian tissue along the anterior aspect of  the cystic mass.  No solid components are noted and there is no adenopathy  or ascites noted.  The patient denies any fever or chills or any other GI or  GU symptoms.   PAST MEDICAL HISTORY:  Medical illnesses:  The patient has had a number of  foot problems.   PAST SURGICAL HISTORY:  Cholecystectomy, mastoidectomy, abdominal  hysterectomy, and bilateral salpingo-oophorectomy for endometriosis 1979.   The patient had an ovarian remnant in 1996 which was resected by Dr. Stanford Breed and Dr. Ambrose Mantle.  Review of that operative note indicates that this  is an extensive retroperitoneal dissection in all of the lateral and central  pelvic spaces.  Apparently, there was difficulty in identifying a cystic  pelvic mass despite intraoperative ultrasound.  At the completion of the  surgical procedure the final pathology showed the right pelvic side wall  cyst contained benign ovarian tissue remnants with a benign serous cyst and  foci of endometriosis.  No malignancy was identified.  Right paravaginal  tissue showed fibroadipose tissue and smooth muscle fragments.  No  malignancy was identified in that specimen either.  The patient has  apparently done well since 1996 until just recently.   ALLERGIES:  SULFA and CIPRO.   CURRENT MEDICATIONS:  Antihypertensive.  The patient is also on hormone  replacement therapy.   REVIEW OF SYMPTOMS:  Negative except as noted above.   PHYSICAL EXAMINATION:  VITAL SIGNS:  Height 5 feet 4 inches, weight 143  pounds, blood pressure 140/70, pulse 80, respiratory rate 18.  GENERAL:  The patient is a healthy white female in no acute distress.  HEENT:  Negative.  NECK:  Supple without thyromegaly.  LYMPH:  There is no supraclavicular, inguinal adenopathy.  ABDOMEN:  Soft, nontender.  No masses, organomegaly, ascites, or hernias are  noted.  She has a well healed midline incision and a Pfannenstiel incision.  There is no costovertebral angle tenderness.  EXTREMITIES:  Lower extremities without edema or varicosities and have full  range of motion.  PELVIC:  EGBUS, vagina, bladder, urethra  are normal.  Bimanual examination  does not reveal a mass but on rectovaginal examination there is a right-  sided mass that is in the perirectal space posterior in the pelvis measuring  approximately 4-5 cm in diameter.  This is minimally tender.   IMPRESSION:  Recurrent right pelvic side wall cyst of questionable etiology.  This may be an ovarian remnant or a peritoneal inclusion cyst.  Given the  fact that it is in the very similar location to prior surgery in 1996 and  having reviewed the operative note, I believe the surgical procedure to  resect this mass could be very difficult given certain retroperitoneal  fibrosis.  Because this looks benign, I think consideration should be given  to the possibility of aspiration of this cyst and submitting this to  pathology.  We will review the x-rays with the radiologist to see whether  they might be able to do aspiration of this cyst.   The patient is scheduled to see Dr. Hilbert Bible later this week.  We will  obtain a CA-125 value today.                                                De Blanch, MD    DC/MEDQ  D:  04/19/2002  T:  04/19/2002  Job:  [id number]   cc:   Malachi Pro. Ambrose Mantle, M.D.  [address]  [place]  [place] [address]  Fax: [phone]   Telford Nab, R.N.  [address] [place], [place] [address]  Fax: 1

## 2010-11-28 NOTE — Op Note (Signed)
NAME:  [name], [name]                         ACCOUNT NO.:  [id number]   MEDICAL RECORD NO.:  [id number]                   PATIENT TYPE:  AMB   LOCATION:  DAY                                  FACILITY:  [facility]   PHYSICIAN:  R. Roetta Sessions, M.D.              DATE OF BIRTH:  1941/04/22   DATE OF PROCEDURE:  DATE OF DISCHARGE:                                 OPERATIVE REPORT   PROCEDURE:  Surveillance esophagogastroduodenoscopy.   ENDOSCOPIST:  Gerrit Friends. Rourk, M.D.   INDICATIONS FOR PROCEDURE:  The patient is a 70 year old lady who was found  to have a prepyloric ulcer (likely secondary to aspirin and Celebrex  previously). She had marked iron-deficiency anemia. She has been seeing Dr.  Mariel Sleet, and has been on oral iron supplementation with improvement in her  hemoglobin.  Her stools have been chronically dark.  She has not had any  bowel symptoms.  Certainly no hematochezia.  Prior colonoscopy demonstrated  no significant findings to account for her iron deficiency via GI bleeding.  Small bowel biopsies revealed no villous atrophy.  Small-bowel follow  through normal. [facility] imaging capsule study demonstrated some stenotic  areas and multiple small ulcerations in the small bowel (per Dr. Stan Head) consistent with NSAID use previously.  She is here to ensure that  the previously noted gastric ulcer has healed. She was noted to have  evidence of H. pylori and was treated with triple drug therapy. She  continues to adamantly deny any use of nonsteroidal agents at this time.  She has had some right lower quadrant abdominal pain felt to be secondary to  a complex ovarian cyst.  She has seen Dr. Stanford Breed who favors  conservative management per the patient's report.  EGD is now being done to  document ulcer healing.  This approach has been discussed with the patient  previously.  The potential risks, benefits, and alternatives have been  reviewed.  Please see my handwritten  H&P.   PROCEDURE NOTE:  O2 saturation, blood pressure, pulse and respirations were  monitored throughout the entire procedure.  Conscious sedation: Versed 4 mg  IV, Demerol 100 mg IV in divided doses.   INSTRUMENT:  Olympus video chip adult gastroscope.   FINDINGS:  Examination of the tubular esophagus revealed no mucosal  abnormalities.  The EG junction was easily traversed.   STOMACH:  The gastric cavity was empty.  It insufflated well with air.  A  thorough examination of the gastric mucosa including a retroflex view of the  proximal stomach and esophagogastric junction demonstrated no abnormalities.  The previously noted gastric ulcer was gone.  The pylorus was patent and  easily traversed.   Examination of the bulb, second and third portion of the duodenum  demonstrated no mucosal abnormalities.   THERAPEUTIC/DIAGNOSTIC MANEUVERS:  None.   The patient tolerated the procedure well and was reacted in endoscopy.   IMPRESSION:  1. Normal esophagus.  2. Normal stomach.  3. Normal D1, D2, and D3.   Today's findings are reassuring.   RECOMMENDATIONS:  We will check a CBC today.  Further recommendations to  follow.                                               Jonathon Bellows, M.D.    RMR/MEDQ  D:  02/27/2003  T:  02/27/2003  Job:  [id number]   cc:   De Blanch, M.D.   Ladona Horns. Neijstrom, MD  [address]  [place]  [place] [address]  Fax: [phone]   Kingsley Callander. Ouida Sills, M.D.  [address]  [place]  [place] [address]  Fax: [phone]

## 2010-11-28 NOTE — Consult Note (Signed)
[facility]  Patient:    [name], MUCH Visit Number: [id number] MRN: [id number]          Service Type: FTC Location: FOOT Attending Physician:  Sherri Rad Dictated by:   Nadara Mustard, M.D. Proc. Date: 04/28/01 Admit Date:  04/28/2001                            Consultation Report  HISTORY OF PRESENT ILLNESS:  Patient is a 70 year old woman with Charcot arthropathy of the left foot.  Patient presents for evaluation in a ______ walker.  Patient presents for total contact cast treatment.  Most recent hemoglobin A1c 5.2.  Height 5 foot 7 inches.  Weight 145 pounds.  SOCIAL HISTORY:  Tobacco use:  None.  PRIMARY CARE PHYSICIANS: 1. Dr. Sherri Rad, orthopedics. 2. Dr. Carylon Perches, primary care physician in [place].  SIGNIFICANT MEDICAL DIAGNOSES: 1. 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 2. Hypertension.  SIGNIFICANT SURGERIES:  Hysterectomy, mastoid surgery, oophorectomy and cholecystectomy.  ALLERGIES:  SULFA.  MEDICATIONS:  Estrogen, Avalide, Celebrex and Actonel.  PHYSICAL EXAMINATION:  EXTREMITIES:  On examination of both feet, she does have both calluses underneath the bilateral heels and left great toe first metatarsal head.  She does have pes planus.  She does have palpable dorsalis pedis pulses bilaterally.  She does have clawing of the toes and does not have protective sensation.  X-RAY FINDINGS:  Radiographs are reviewed which show a Charcot arthropathy.  ASSESSMENT:  Charcot arthropathy, left foot.  PLAN:  Patients ______  walker was adjusted by [employer].  She was placed back in her adjusted ______ walker.  Plan to follow up in one week. Dictated by:   Nadara Mustard, M.D. Attending Physician:  Sherri Rad DD:  05/10/01 TD:  05/11/01 Job: [id number]

## 2010-11-28 NOTE — H&P (Signed)
NAME:  ETIENNE, MILLWARD NO.:  [id number]   MEDICAL RECORD NO.:  [id number]                   PATIENT TYPE:  INP   LOCATION:                                       FACILITY:  [facility]   PHYSICIAN:  Melvyn Novas, M.D.               DATE OF BIRTH:  [birth year]   DATE OF ADMISSION:  02/17/2003  DATE OF DISCHARGE:                                HISTORY & PHYSICAL   REASON FOR ADMISSION:  This pleasant 70 year old Caucasian right-handed  female who had either one prolonged or two separate episodes of horizontal  diplopia earlier this a.m. on February 17, 2003.  One occurred this morning  while she was driving with her husband out of town.  She noticed that in the  car the cars in the oncoming traffic on the left side of the road seemed to  have a halo and that she had horizontal double vision.  She also felt very  dizzy and then her husband turned around and went with her to the [facility]  Emergency Room in [place] where she was evaluated in the ER by Dr. Rosalia Hammers.  There she noted either a second spell or perhaps a prolongation of the same  when she looked up to a TV that was mounted to the ceiling on the right side  of the room and noticed again horizontal diplopia.  The total spell might  have lasted 35 minutes and no medications were given to resolve it.  She  received three baby aspirin by the ER physician once the spell was already  over.   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 Love, M.D.  at [facility].  She suffers from chronic anemia and has positive Hemoccult stools  with ulcerative gastritis, hypertension.  She has insomnia.   PAST SURGICAL HISTORY:  Positive for a hysterectomy supposedly total in 1979  but in 1996 the patient was diagnosed with ovarian tissue remnants that have  become cystic  but by aspiration cytology not malignantly changed.   MEDICATIONS:  1. Neurontin 300 mg four times a day.  2. Cozaar 30 mg per day.  3. Premarin.  4. Vitamin supplements.  5. She takes a prenatal vitamin with iron ferratin.  6. She takes Ambien at night 10 mg p.o.   ALLERGIES:  She is allergic to CIPRO and to CERTAIN SULFA DRUGS.   FAMILY HISTORY:  The patient is adopted.  Has no known siblings and has no  knowledge of her biological family at all.   SOCIAL HISTORY:  She is married with an adult child.  She has never smoked,  drinks only occasional alcohol and less than 3 or 4 times a year.  She has  no history of illegal drug use.  The patient is retired, now mainly a Dispensing optician.   PHYSICAL EXAMINATION:  VITAL SIGNS:  Blood pressure 161/66, respiratory rate  is 18, the heart rate is 64 and regular.  NECK:  There are no carotid bruits.  No neck venous distention.  No goiter.  LUNGS:  Clear to auscultation bilaterally.  CARDIOVASCULAR:  Regular rate and rhythm.  No murmurs.  EXTREMITIES:  Charcot joints on both central feet.  Profound neuropathy with  decreased sensory to primary modalities.  Toes and feet also seem to curl  inwards.  This inversion is being counteracted by wearing braces at home.  Fingers and lips also seem to be involved either in the neuropathy or in  another process, and she states that she often has tingling dysesthesias in  those two regions.  NEUROLOGICAL:  Cranial Nerves:  Pupils equally full to light and  accommodation, extraocular movements intact, visual fields fully intact.  No  papilledema detected.  No peripheral vision loss or aura.  Full facial  strength and sensory, and jugular movement line, gag was intact.  The neck  is supple.  Motor exam:  She has 5 out of 5 equal strength, tone and mass.  Good bilateral grip strength.  Dorsiflexion and plantar flexion in both feet  seem to be limited.  The patient has, however, intact hip flexion and knee   flexion.  The neuropathy involves mainly the distal nerves and has caused  her to lose vibration, fine touch, and temperature sensory.  Coordination  shows a finger-nose test dysmetria on the left more than the right side.  She also has no abnormalities of muscle tone nor tremor ataxia.   ASSESSMENT:  1. Possible transient ischemic attack with horizontal diplopia of suspected     brain stem perfusion.  The patient has no history of migraine, had no     associated headaches or visual aura.  2. History of hypertension which is mild and well controlled on current     medication.  3. History of neuropathy sensory motor with Charcot joints, question of an     inherited type.  The patient is adopted so a family could not be traced.   PLAN:  To obtain MRI with MRA, echocardiogram 2-D and EKG.  She will stay  overnight on telemetry.  We will obtain also fasting lipid profiles, and  since we have put her on IV heparin with a bolus, will follow her Hemoccults  due to the history of ulcerative gastritis.  She will be admitted to the  stroke MD service and followed by Dr. Sunny Schlein. Sethi.                                                 Melvyn Novas, M.D.    CD/MEDQ  D:  02/18/2003  T:  02/18/2003  Job:  [id number]

## 2010-11-28 NOTE — Op Note (Signed)
NAME:  [name], CHICO                         ACCOUNT NO.:  [id number]   MEDICAL RECORD NO.:  [id number]                   PATIENT TYPE:  AMB   LOCATION:  DAY                                  FACILITY:  [facility]   PHYSICIAN:  R. Roetta Sessions, M.D.              DATE OF BIRTH:  [birth year]   DATE OF PROCEDURE:  10/19/2002  DATE OF DISCHARGE:                                 OPERATIVE REPORT   PROCEDURE:  Esophagogastroduodenoscopy with biopsy.   ENDOSCOPIST:  Gerrit Friends. Rourk, M.D.   INDICATIONS FOR PROCEDURE:  The patient is a 70 year old lady who was found  by Dr. Ouida Sills to have a profound microcytic anemia recently.  She is  hemoccult positive.  She has frequent reflux symptoms.   She takes aspirin and Celebrex.  Dr. Ouida Sills asked her to hold her Celebrex  yesterday.  EGD is now being done to further evaluate her symptoms.  This  approach has been discussed with the patient at length at the bedside.  The  potential risks, benefits, and alternatives have been reviewed; questions  answered.  Please see my handwritten H&P for more information.   MONITORING:  O2 saturation, blood pressure, pulse and respirations were  monitored throughout the entire procedure.  Conscious sedation: Versed 5 mg  IV, Demerol 100 mg IV in divided doses.  Cetacaine spray for topical  oropharyngeal anesthesia.   INSTRUMENT:  Olympus video chip gastroscope.   FINDINGS:  Examination of the tubular esophagus revealed no mucosal  abnormalities.  The EG junction was easily traversed.   STOMACH:  The gastric cavity was empty.  It insufflated well with air.  A  thorough examination of the gastric mucosa including a retroflex view of the  proximal stomach and esophagogastric junction demonstrated a 2 x 6 mm ulcer  with a clean base in the antrum.  The remainder of the gastric mucosa was  well seen and appeared normal.  The pylorus was patent and easily traversed.   DUODENUM:  Examination of the bulb, second and  third portion revealed no  abnormalities.   THERAPEUTIC/DIAGNOSTIC MANEUVERS:  Biopsies of D2 and D3 were taken to  screen for celiac disease (biopsies were done before I learned after the  procedure that he is Hemoccult positive per Dr. Ouida Sills).  Subsequently antral  biopsies x2 for a CLOtest were obtained.  The patient tolerated the  procedure well and was reacted in endoscopy.   IMPRESSION:  1. Normal esophagus.  2. A 2 x 6 mm (small) prepyloric antral ulcer.  The remainder of the stomach     appeared normal.  3. D1, D2, and D3 appeared normal.  CLO biopsy pending.  Biopsy of the     duodenum pending for histologic study.   RECOMMENDATIONS:  1. Continue to hold Celebrex and aspirin for now.  2. Begin Aciphex 200 mg orally daily.  3. The patient is to go my office  to receive free-samples. She is to     schedule a colonoscopy the first of next week through my office this     afternoon.  Further recommendations to follow.  4. I have discussed my findings and recommendations with Dr. Ouida Sills today via     telephone today.  5. Further recommendations to follow.                                               Jonathon Bellows, M.D.    RMR/MEDQ  D:  10/19/2002  T:  10/20/2002  Job:  [id number]   cc:   Kingsley Callander. Ouida Sills, M.D.  [address]  [place]  [place] [address]  Fax: [phone]

## 2010-11-28 NOTE — Consult Note (Signed)
NAME:  [name], [name]                         ACCOUNT NO.:  [id number]   MEDICAL RECORD NO.:  [id number]                   PATIENT TYPE:  OUT   LOCATION:  GYN                                  FACILITY:  [facility]   PHYSICIAN:  De Blanch, M.D.         DATE OF BIRTH:  01-04-1941   DATE OF CONSULTATION:  08/15/2003  DATE OF DISCHARGE:                                   CONSULTATION   HISTORY:  [age]-year-old white female returns for continuing followup of  a retroperitoneal cystic mass.   Since her last visit the patient has done well.  She has had intermittent  minimal discomfort in the right lower quadrant which is very tolerable and  requires no pain medication.  She denies any other GI or GU symptoms.  The  patient has been undergoing an extensive workup for anemia under the  direction of Dr. Glenford Peers in [place] and underwent surveillance  esophagogastroduodenoscopy for reevaluation of a prepyloric ulcer.  The exam  showed a normal esophagus, stomach, and duodenum.   From a gynecologic point of view the patient has no pelvic pain, pressure,  vaginal bleeding or discharge, and her functional status is very good.   PAST MEDICAL ILLNESSES:  Chronic anemia, a number of orthopedic problems in  her feet and lower legs.   PAST SURGICAL HISTORY:  Cholecystectomy, mastoidectomy, abdominal  hysterectomy and bilateral salpingo-oophorectomy for endometriosis 1979,  resection of paracervical mass/ovarian remnant 1996.   DRUG ALLERGIES:  SULFA and CIPRO.   CURRENT MEDICATIONS:  Antihypertensive.  Patient is no longer on hormone  replacement therapy.   REVIEW OF SYSTEMS:  Essentially negative except as noted above.   PHYSICAL EXAMINATION:  VITALS:  Weight 140 pounds, blood pressure 160/84.  GENERAL:  The patient is a healthy white female in no acute distress.  HEENT:  Negative.  NECK:  Supple without thyromegaly.  There is no supraclavicular or inguinal   adenopathy.  ABDOMEN:  Soft, nontender.  No mass, organomegaly, ascites, or hernias  noted.  PELVIC:  EGBUS, vagina, bladder, urethra are normal.  The vagina is without  lesions.  Bimanual exam is normal in the anterior pelvis although on  rectovaginal exam approximately a 5-6 cm cystic mass can be felt to the  right of the rectum, this is unchanged from prior notation.   Ultrasound was obtained today and shows no change in the cystic mass.   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  I continue to feel that we should observe this mass  rather than run the risk of major surgical intervention and potential  complications.  The patient is very comfortable with this recommendation.  We will plan on seeing her back again in 1 year and have an ultrasound just  prior to that visit for documentation.  She will call us if she has any  interval problems.  De Blanch, M.D.    DC/MEDQ  D:  08/15/2003  T:  08/16/2003  Job:  [id number]   cc:   Ladona Horns. Neijstrom, MD  [address]  [place]  [place] [address]  Fax: [phone]   R. Roetta Sessions, M.D.  [address]  [place]  [place] [address]  Fax: [phone]   Kingsley Callander. Ouida Sills, M.D.  [address]  [place]  [place] [address]  Fax: [phone]   Genene Churn. Love, M.D.  [address]  [place]  [place] [address]  Fax: [phone]   Telford Nab, R.N.  [address]  [place], [place] [address]

## 2010-11-28 NOTE — H&P (Signed)
[name], [name]                           ACCOUNT NO.:  [id number]   MEDICAL RECORD NO.:  [id number]                    PATIENT TYPE:  EMS   LOCATION:                                       FACILITY:   PHYSICIAN:  Edward L. Juanetta Gosling, M.D.             DATE OF BIRTH:   DATE OF ADMISSION:  DATE OF DISCHARGE:                                HISTORY & PHYSICAL   REASON FOR ADMISSION:  TIA.   HISTORY:  This is a 70 year old patient of Dr. Ouida [name] who has a number of  medical problems including a neuropathy of unknown cause, anemia which is  felt to be iron deficiency with apparently an ulcer found recently on a GI  pill study, but I do not have all the information about that. She was in her  usual state of poor health at home; and [place] when she developed double vision that lasted  about 30 minutes. Her husband says that she was dizzy and had difficulty  with standing when she arrived at the emergency room and he did not feel  that she would have been able to ambulate into the emergency room  unassisted.  She says that she really did not have any other symptoms other  than the double vision, and specifically says that she did not have any  increased weakness. No difficulty with speech.  She did not have any  increased weakness.  She did not have any other new complaints.  She has not  had any cardiac arrhythmias. She does not have any other complaints at all  now. She says that her neuropathy seems to be gradually worsening and she  has been seeing Dr. Sandria [name] in [place] about this.  She has had a number of  procedures including a colonoscopy, an EGD and then the pill study done at  [facility].  She has been followed by Dr. Stanford [name] for a  pelvic cyst and in 1996 had to have an extensive retroperitoneal procedure,  but it was found to be benign ovarian tissue.  Her EGD was done October 19, 2002 and it showed a normal  esophagus, a prepyloric antral ulcer, and normal  duodenum.  She had a colonoscopy on April 13 and also ileoscopy which showed  internal hemorrhoids and anal papillae, otherwise normal colon, normal  terminal ileum.  In the emergency room now her CBC shows a hemoglobin 12.9.  It was checked last week and it was 12.3.  BMET today was normal.  Hepatic  function, in essence normal except for a slightly low albumin and her  ProTime is normal.   PAST MEDICAL HISTORY:  Her past medical history findings with problems with  her pelvic cyst with the peripheral neuropathy, probable peptic ulcer  disease, and a chronic anemia.  She also is hypertensive.   MEDICATIONS:  She  has been on Neurontin, Premarin, vitamins and Cozaar.   SOCIAL HISTORY:  She does not smoke. She does not drink any alcohol.  She  lives with her husband.   FAMILY HISTORY:  Her family history is negative for strokes.   REVIEW OF SYSTEMS:  Except as mentioned is negative.   PHYSICAL EXAMINATION:  VITAL SIGNS:  Her exam shows that her blood pressure  is 159/72, pulse is 80, respirations are 18.  She is afebrile.  HEENT:  Shows that her pupils are equal round, react to light and  accommodation now.  I cannot see her fundi well.  Her tongue protrudes  midline.  Mucous membranes are moist.  Tympanic membranes are intact.  NECK:  Her neck is supple. No masses.  No bruits.  CHEST:  Clear without wheezes, rales or rhonchi.  HEART:  Regular.  I do not hear a gallop.  ABDOMEN:  Soft without masses.  EXTREMITIES:  Show that she wears a brace on her leg.  Both extremities are  weak, but she says that this is no different than usual.   CT scan of the head without contrast was done and is normal.  As mentioned,  Dr. Rosalia [name] has discussed this with Dr. Vickey [name] who is on call for Dr. Sandria [name],  the patient's neurologist. Dr. Vickey [name] was concerned that this event sounds  like it is probably an embolic event and would need to be treated with   anticoagulation.  The patient has a very weakly positive stool for blood.  She has a history of anemia and probable ulcer disease.  I have discussed  her situation with Dr. Karilyn [name] who is on call for her gastroenterologist, Dr.  Jena [name] and he feels that we should proceed with anticoagulation, check serial  hemoglobins and hematocrits and check our stools for blood. I think that  that is a reasonable approach. It is a difficult situation because she has 2  problems that complicate the management of each other.   PLAN:  I will plan to continue with her other medications in the meantime  and have her setup for carotid Dopplers, echocardiogram and MRI with MRA.                                               Edward L. Juanetta Gosling, M.D.    ELH/MEDQ  D:  02/17/2003  T:  02/17/2003  Job:  [id number]

## 2010-11-28 NOTE — Consult Note (Signed)
NAME:  [name], [name]                         ACCOUNT NO.:  [id number]   MEDICAL RECORD NO.:  [id number]                   PATIENT TYPE:  OUT   LOCATION:  GYN                                  FACILITY:  [facility]   PHYSICIAN:  De Blanch, M.D.         DATE OF BIRTH:  [birth year]   DATE OF CONSULTATION:  02/21/2003  DATE OF DISCHARGE:                                   CONSULTATION   REASON FOR CONSULTATION:  A 70 year old white female returns for continued  followup of a pelvic peritoneal inclusion cyst. Over the past six months,  the patient has done reasonably well. She has some pelvic discomfort on the  right side which she would score as a 2/10. Otherwise she has done well and  has had no other pelvic symptoms. She has had a CAT scan on December 19, 2002  which shows a multiloculated cystic mass in the region of the cul-de-sac  measuring 6.6 x 4 x 5 cm. This is slightly enlarged over prior exam. There  is no apparent nodules, free fluid or other adenopathy. The patient has had  a number of other medical problems that she has been dealing with in recent  months including severe iron deficiency anemia, prepyloric ulcer, peripheral  neuropathy, Charcot joint, and recent episode of syncope.   Past medical history, review of systems, family history and social history  are reviewed and are essentially unchanged from previous notations.   PHYSICAL EXAMINATION:  GENERAL:  The patient is a healthy, slender, white  female in no acute distress.  HEENT:  Negative.  NECK:  Supple without thyromegaly.  ABDOMEN:  Soft, nontender, no masses, organomegaly, ascites or hernias are  noted.  PELVIC:  EGBUS, vagina, bladder, urethra are normal. Cervix and uterus  surgically absent.  Bimanual and rectovaginal exam reveal approximately 5 cm  cystic structure deep in the pelvis felt best on rectal exam. This was  almost a presacral mass. This feels essentially the same as it did to me in  February  of this year.   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 Cyst fluid has previously been  aspirated and was benign cytologically.   PLAN:  Given the fact that this was a relatively asymptomatic cyst which  appears to be benign and is of longstanding, I have reassured the patient I  do not believe she has cancer and that surgical intervention is not  warranted or necessary.   Certainly she has other medical problems that are of higher priority and my  advice would be that we follow the cyst with serial pelvic exams at six  months intervals. The patient's questions are answered and she is  comfortable with this recommendation. I did indicate I would share my  thoughts regarding this cyst with her other physician so they do not worry  about it either. I did reassure her  that I did not believe the cyst had  anything to do with her anemia or her recent episode of syncope.                                               De Blanch, M.D.    DC/MEDQ  D:  02/21/2003  T:  02/21/2003  Job:  [id number]   cc:   Ladona Horns. Neijstrom, MD  [address]  [place]  [place] [address]  Fax: [phone]   R. Roetta Sessions, M.D.  [address]  [place]  [place] [address]  Fax: [phone]   Kingsley Callander. Ouida Sills, M.D.  [address]  [place]  [place] [address]  Fax: [phone]   Genene Churn. Love, M.D.  [address]  [place]  [place] [address]  Fax: [phone]   Telford Nab, R.N.  [address]  [place], [place] [address]

## 2010-11-28 NOTE — Consult Note (Signed)
[name], [name]               ACCOUNT NO.:  [id number]   MEDICAL RECORD NO.:  [id number]          PATIENT TYPE:  OUT   LOCATION:  GYN                          FACILITY:  [facility]   PHYSICIAN:  De Blanch, M.D.DATE OF BIRTH:  Oct 04, 1940   DATE OF CONSULTATION:  09/09/2004  DATE OF DISCHARGE:                                   CONSULTATION   CHIEF COMPLAINT:  Pelvic cyst.   INTERVAL HISTORY:  Since her last visit a year ago, the patient developed  enough pelvic pressure and pain that she underwent cyst aspiration with  ultrasound guidance in October of 2005.  135 mL of clear fluid was drained,  the patient felt much better. Currently she is having minimal discomfort  when she lies on her left side otherwise she has absolutely no symptoms.   HISTORY OF PRESENT ILLNESS:  The patient has had retroperitoneal inclusion  cyst having undergone initial attempts at resection.  With recurrence, we  have managed her conservatively and had no difficulties.  She is essentially  asymptomatic.   PAST MEDICAL HISTORY:  Medical illnesses:  Iron deficiency anemia,  prepyloric ulcer, peripheral neuropathy, Charcot joint left foot.   PAST SURGICAL HISTORY:  1.  TAH/BSO.  2.  Exploratory laparotomy for residual ovary 2003.  3.  Cholecystectomy.  4.  Tonsils and adenoidectomy.  5.  Breast biopsy.  6.  Cyst removal.   ALLERGIES:  SULFA and CIPRO.   CURRENT MEDICATIONS:  Antihypertensive, Neurontin. The patient has been on a  number of other medications because she has a metallic taste in her mouth.   REVIEW OF SYMPTOMS:  The patient has no cardiovascular, pulmonary,  musculoskeletal, GI or GU or gynecologic symptoms.   PHYSICAL EXAMINATION:  VITAL SIGNS:  Weight 143.5 pounds, blood pressure  152/90.  GENERAL:  The patient is a healthy white female in no acute distress.  HEENT:  Negative.  NECK:  Supple without thyromegaly. There was no supraclavicular or inguinal  adenopathy.  ABDOMEN:  Soft, nontender, no mass, organomegaly, ascites or hernias are  noted. A midline incision is well healed.  PELVIC:  EGBUS, vagina, bladder, urethra are normal. Cervix and uterus  surgically absent.  Adnexa are without masses. However, on rectovaginal  exam, there is a cystic structure measuring approximately 6 cm in diameter  in the right posterior pelvis in the presacral region.   The patient had an ultrasound on February 22, this is reviewed. She has a  cystic lesion measuring 7.5 x 4.8 x 7.5 cm.  This is essentially stable from  a scan obtained in August of 2005.   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  Previous attempts at surgical resection have failed and I  believe conservative management with followup remains the most reasonable  approach to her care. She is in agreement with this plan.  We will plan on  seeing her back in a year and have an ultrasound just prior to that visit.  She will contact us if she develops any worsening symptoms before then.      DC/MEDQ  D:  09/09/2004  T:  09/09/2004  Job:  [id number]   cc:   Ladona Horns. Neijstrom, MD  [address]  [place]  [place] [address]  Fax: [phone]   R. Roetta Sessions, M.D.  [address]  [place]  [place] [address]   Kingsley Callander. Ouida Sills, MD  [address]  [place]  [place] [address]  Fax: [phone]   Genene Churn. Love, M.D.  [address]  [address]  [place]  [place] [address]  Fax: [phone]   Telford Nab, R.N.  [address]  [place], [place] [address]

## 2010-11-28 NOTE — Op Note (Signed)
NAME:  [name], [name]                         ACCOUNT NO.:  [id number]   MEDICAL RECORD NO.:  [id number]                   PATIENT TYPE:  AMB   LOCATION:  DAY                                  FACILITY:  [facility]   PHYSICIAN:  R. Roetta Sessions, M.D.              DATE OF BIRTH:  1941/05/26   DATE OF PROCEDURE:  10/24/2002  DATE OF DISCHARGE:                                 OPERATIVE REPORT   PROCEDURE:  Diagnostic colonoscopy with ileoscopy.   INDICATION FOR PROCEDURE:  The patient is a 70 year old lady who presented  with profound iron-deficiency anemia, Hemoccult-positive.  EGD last week  demonstrated a prepyloric antral ulcer.  She had been taking aspirin and  Celebrex.  The ulcer was not particularly large, although it was a good 4 x  6 mm, and it was felt that she needed to have a colonoscopy to further  evaluate her microcytic anemia and heme-positive status.  Consequently she  is brought back for colonoscopy today.  She was started on Aciphex last  week.  Colonoscopy has been discussed with the patient previously.  The  potential risks, benefits, and alternatives have been reviewed, questions  answered.  Please see my 10/18/02 H&P for more information.   MONITORING:  O2 saturation, blood pressure, pulse, and respirations were  monitored throughout the entire procedure.   ANESTHESIA:  Conscious sedation with IV Versed and Demerol in incremental  doses.  She got Zofran 4 mg IV because of nausea following Demerol last  week.   INSTRUMENT USED:  Olympus video chip adult colonoscope.   FINDINGS:  Digital rectal exam revealed no abnormalities.   ENDOSCOPIC FINDINGS:  Prep was good.   Rectum:  Examination of the rectal mucosa including retroflexed view of the  anal verge revealed only internal hemorrhoids and three anal papillae.   Colon:  The colonic mucosa was surveyed from the rectosigmoid junction  through the left, transverse, and right colon, to the area of the  appendiceal  orifice, ileocecal valve, and the cecum.  These structures were  well-seen and photographed for the record.  The colonic mucosa appeared  normal all the way to the cecum.  The terminal ileum was intubated to 10 cm.  This segment of GI tract also appeared normal.  From the level of the cecum  and the ileocecal valve, the scope was slowly and cautiously withdrawn.  All  previously-mentioned mucosal surfaces were again seen, and again no  abnormalities were observed.  The patient tolerated the procedure well and  was reactive in endoscopy.   IMPRESSION:  1. Internal hemorrhoids and anal papillae, otherwise normal rectum.  2. Normal colon.  3. Normal terminal ileum.   I suspect the patient has significant iron-deficiency anemia on the basis of  gastrointestinal blood loss from peptic ulcer, and other lesions more  distally in the small bowel are not excluded.   RECOMMENDATIONS:  1. Continue to  refrain from taking aspirin, Celebrex, etc.  2. Continue taking Aciphex 20 mg orally daily.  3. Begin Niferex capsules one b.i.d. today.  4. CBC with Dr. Ouida Sills next week.  He is to call to get that scheduled.  I     discussed my findings and recommendations with Dr. Carylon Perches via     telephone this afternoon.  5. At a minimum, she will need to have a repeat EGD in three months to     document healing of the gastric ulcer.  6. Will have her back in the office to see me in eight weeks.  7. Will follow up on the pending CLOtest and small bowel biopsies.                                               Jonathon Bellows, M.D.    RMR/MEDQ  D:  10/24/2002  T:  10/24/2002  Job:  [id number]   cc:   Kingsley Callander. Ouida Sills, M.D.  [address]  [place]  [place] [address]  Fax: [phone]

## 2010-11-28 NOTE — Consult Note (Signed)
NAMEPRESSLEY, BARSKY               ACCOUNT NO.:  [id number]   MEDICAL RECORD NO.:  [id number]          PATIENT TYPE:  OUT   LOCATION:  GYN                          FACILITY:  [facility]   PHYSICIAN:  De Blanch, M.D.DATE OF BIRTH:  [birth year]   DATE OF CONSULTATION:  01/06/2006  DATE OF DISCHARGE:                                   CONSULTATION   GYN ONCOLOGY CLINIC:   CHIEF COMPLAINT:  Pelvic cyst.   INTERVAL HISTORY:  Since her last visit, the patient has done well.  As has  been her past history, she developed pressure in the right lower quadrant  and pelvis and had a pelvic cyst re-aspirated on Dec 01, 2005; 325 mL of  fluid were removed.  Cytology showed no evidence of malignancy.   HISTORY OF PRESENT ILLNESS:  The patient has had a retroperitoneal inclusion  cyst, previously undergoing attempts at surgical resection, but with  recurrences, we have decided to manage this conservatively using CT-guided  and ultrasound-guided aspiration as needed.  On average, it seems that she  needs to have this managed approximately once a year.   PAST MEDICAL HISTORY:   MEDICAL ILLNESSES:  1.  Iron deficiency anemia.  2.  Prepyloric ulcer.  3.  Peripheral neuropathy.  4.  Charcot joint, left foot.   PAST SURGICAL HISTORY:  1.  TAH/BSO.  2.  Exploratory laparotomy with residual ovary, 2003.  3.  Cholecystectomy.  4.  Tonsils and adenoidectomy.  5.  Breast biopsy/cyst removal present.  The patient had a breast biopsy      approximately 3 months ago.   DRUG ALLERGIES:  SULFA and CIPRO.   CURRENT MEDICATIONS:  An antihypertensive, Lyrica.   REVIEW OF SYSTEMS:  Ten-point comprehensive review of systems is negative,  except as noted above.   PHYSICAL EXAM:  VITAL SIGNS:  Weight 145 pounds, blood pressure 110/70,  pulse 89, respiratory rate 20.  GENERAL:  The patient is a healthy white female in no acute distress.  HEENT:  Negative.  NECK:  Supple without thyromegaly.  LYMPHATICS:  There is no supraclavicular, axillary or inguinal adenopathy.  ABDOMEN:  Soft and nontender.  No mass, organomegaly, ascites or hernias are  noted.  PELVIC:  EG/BUS, vagina, bladder and urethra are normal.  Cervix and uterus  are surgically absent.  Adnexa without masses.  Rectovaginal exam confirms.  LOWER EXTREMITIES:  Without edema or varicosities.   IMPRESSION:  Recurrent retroperitoneal inclusion cyst requiring drainage  approximately once a year.  The pros and cons of continued aspiration versus  a surgical re-exploration were discussed and we have agreed to continue the  strategy of aspirating on an as-needed basis.      De Blanch, M.D.  Electronically Signed     DC/MEDQ  D:  01/06/2006  T:  01/06/2006  Job:  [id number]   cc:   Ladona Horns. Mariel Sleet, MD  Fax: [phone]   R. Roetta Sessions, M.D.  [address]  [place]  [place] [address]   Kingsley Callander. Ouida Sills, MD  Fax: [phone]   Genene Churn. Love, M.D.  Fax: [phone]  Telford Nab, R.N.  [address]  [place], [place] [address]

## 2010-11-28 NOTE — H&P (Signed)
   NAMERIANNON, MUKHERJEE                         ACCOUNT NO.:  [id number]   MEDICAL RECORD NO.:  [id number]                   PATIENT TYPE:  EMS   LOCATION:  ED                                   FACILITY:  [facility]   PHYSICIAN:  Edward L. Juanetta Gosling, M.D.             DATE OF BIRTH:  1940/08/02   DATE OF ADMISSION:  02/17/2003  DATE OF DISCHARGE:                                HISTORY & PHYSICAL   ADDENDUM:  After arrangements were being made for admission here, Dr. Porfirio Mylar  Dohmeier called back and stated that there were now telemetry beds available  at [facility] and requested that Ms. Uttech be transferred to [facility]  rather than being admitted at [facility]; I think that is appropriate and we  will plan to go ahead with that plan.                                               Edward L. Juanetta Gosling, M.D.    Gwenlyn Found  D:  02/17/2003  T:  02/17/2003  Job:  [id number]

## 2010-11-28 NOTE — Procedures (Signed)
   NAMEEVALINE, WALTMAN                         ACCOUNT NO.:  [id number]   MEDICAL RECORD NO.:  [id number]                   PATIENT TYPE:  OUT   LOCATION:  RAD                                  FACILITY:  [facility]   PHYSICIAN:  Vida Roller, M.D.                DATE OF BIRTH:  1941-03-18   DATE OF PROCEDURE:  DATE OF DISCHARGE:                                  ECHOCARDIOGRAM   TAPE NUMBER:  [id number].   TAPE COUNT:  [id number] through 3052.   70 year old female with TIAs and hypertension.   TECHNICAL QUALITY OF STUDY:  Adequate though not diagnostic.   M-MODE TRACINGS:  1. The aorta is 35 mm.  2. The left atrium is 28 mm.  3. The septum is 10 mm.  4. The posterior wall is 9 mm.  5. Left ventricular diastolic dimension is 33 mm.  6. Left ventricular systolic dimension is 22 mm.   TWO-D AND DOPPLER IMAGING:  The left ventricle is normal size with normal  systolic function.  There were no obvious wall motion abnormalities.  Diastolic function was not assessed.   The right ventricle was normal size with normal systolic function.   Both atria appear to be normal size.  There is no obvious atrial septal  defect.   The aortic valve is mildly sclerotic, but there is no stenosis or  regurgitation.   The mitral valve is mildly thickened at its leaflet tips with mild  insufficiency.  No stenosis is seen.   The tricuspid valve is morphologically unremarkable with no stenosis or  regurgitation.   The pulmonic valve was not well seen.   The ascending aorta was not well seen.   The inferior vena cava appears to be normal size.   There are no obvious abnormalities in the pericardial structure.       ___________________________________________                                            Vida Roller, M.D.   JH/MEDQ  D:  04/20/2003  T:  04/20/2003  Job:  [id number]

## 2010-12-31 ENCOUNTER — Encounter (HOSPITAL_COMMUNITY): Payer: Self-pay | Admitting: *Deleted

## 2011-01-01 ENCOUNTER — Encounter (HOSPITAL_COMMUNITY): Payer: Self-pay | Admitting: *Deleted

## 2011-01-22 ENCOUNTER — Encounter (HOSPITAL_COMMUNITY): Payer: Commercial Indemnity | Attending: Oncology

## 2011-01-22 ENCOUNTER — Other Ambulatory Visit (HOSPITAL_COMMUNITY): Payer: Self-pay | Admitting: Oncology

## 2011-01-22 DIAGNOSIS — Z862 Personal history of diseases of the blood and blood-forming organs and certain disorders involving the immune mechanism: Secondary | ICD-10-CM

## 2011-01-22 DIAGNOSIS — D509 Iron deficiency anemia, unspecified: Secondary | ICD-10-CM | POA: Insufficient documentation

## 2011-01-22 DIAGNOSIS — I10 Essential (primary) hypertension: Secondary | ICD-10-CM | POA: Insufficient documentation

## 2011-01-22 DIAGNOSIS — G600 Hereditary motor and sensory neuropathy: Secondary | ICD-10-CM | POA: Insufficient documentation

## 2011-01-22 DIAGNOSIS — Z79899 Other long term (current) drug therapy: Secondary | ICD-10-CM | POA: Insufficient documentation

## 2011-01-22 LAB — CBC
HCT: 38.9 % (ref 36.0–46.0)
Hemoglobin: 13.3 g/dL (ref 12.0–15.0)
MCH: 28.2 pg (ref 26.0–34.0)
MCHC: 34.2 g/dL (ref 30.0–36.0)
RDW: 13.2 % (ref 11.5–15.5)

## 2011-01-22 LAB — LIPID PANEL
Cholesterol: 237 mg/dL — ABNORMAL HIGH (ref 0–200)
HDL: 53 mg/dL (ref >39)

## 2011-01-22 LAB — FERRITIN: Ferritin: 44 ng/mL (ref 10–291)

## 2011-01-26 ENCOUNTER — Encounter (HOSPITAL_BASED_OUTPATIENT_CLINIC_OR_DEPARTMENT_OTHER): Payer: Commercial Indemnity | Admitting: Oncology

## 2011-01-26 VITALS — BP 134/68 | HR 71 | Temp 98.1°F | Wt 145.6 lb

## 2011-01-26 DIAGNOSIS — Z862 Personal history of diseases of the blood and blood-forming organs and certain disorders involving the immune mechanism: Secondary | ICD-10-CM

## 2011-01-26 DIAGNOSIS — R071 Chest pain on breathing: Secondary | ICD-10-CM

## 2011-01-26 NOTE — Progress Notes (Signed)
This office note has been dictated.

## 2011-01-26 NOTE — Progress Notes (Signed)
DOCTORS:  Kingsley Callander. Ouida Sills, M.D. and R. Roetta Sessions, MD Caleen [place].  DIAGNOSES: 1. Iron-deficiency anemia in the past that has been corrected.  She     remains on poly iron 150 mg 3 times a week and a multivitamin. 2. 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 3. Recent herpes zoster of the right upper skull and around the right     eye. 4. History of colonoscopy in 2005, negative except for 2 benign     polyps. 5. History of benign left breast biopsy x2 with a benign cyst. 6. Pelvic cyst followed by Dr. Stanford Breed.  Her vital signs are all stable, but she did have shingles about a month ago around her right eye and on her scalp.  She has a little of bit of pain on inspiration presently.  It is only when she breathes in.  She is not in any acute distress.  Respirations are only 15 to 16 and regular. Her lungs are clear at both bases and she points to 1 little spot where it hurts.  Lungs are clear with no rubs, rales, or wheezes, etc.  Good air movement on both sides.  LABORATORIES:  Show her CBC to be normal.  Ferritin is actually up from 28 to 44.  So will see her in a year.  If she is not better, I asked her to see Dr. Ouida Sills in the near future.  I want her to keep up the iron 3 times a week.    ______________________________ Ladona Horns. Mariel Sleet, MD ESN/MEDQ  D:  01/26/2011  T:  01/26/2011  Job:  [id number]

## 2011-04-03 LAB — COMPREHENSIVE METABOLIC PANEL
BUN: 10
CO2: 27
Chloride: 101
Creatinine, Ser: 0.75
GFR calc non Af Amer: >60
Total Bilirubin: 0.9

## 2011-04-03 LAB — CBC
HCT: 37.1
MCV: 83.2
Platelets: 352
WBC: 6.4

## 2011-04-03 LAB — FERRITIN: Ferritin: 28 (ref 10–291)

## 2011-04-08 LAB — BASIC METABOLIC PANEL
BUN: 11
CO2: 27
Chloride: 104
Creatinine, Ser: 0.59

## 2011-04-08 LAB — CBC
MCHC: 34.2
MCV: 82.4
Platelets: 397
WBC: 5.2

## 2011-04-08 LAB — PROTIME-INR: Prothrombin Time: 12.8

## 2011-04-13 LAB — OCCULT BLOOD X 1 CARD TO LAB, STOOL: Fecal Occult Bld: NEGATIVE

## 2011-04-14 LAB — CBC
HCT: 37
Hemoglobin: 12.7
WBC: 6.6

## 2011-04-15 LAB — COMPREHENSIVE METABOLIC PANEL
ALT: 10
CO2: 28
Calcium: 9
Creatinine, Ser: 0.72
GFR calc non Af Amer: >60
Glucose, Bld: 173 — ABNORMAL HIGH
Sodium: 140

## 2011-04-15 LAB — CBC
HCT: 34.6 — ABNORMAL LOW
Hemoglobin: 12.1
MCHC: 35
MCV: 82.7
RBC: 4.18

## 2011-04-15 LAB — FERRITIN: Ferritin: 17 (ref 10–291)

## 2011-04-20 ENCOUNTER — Other Ambulatory Visit: Payer: Self-pay | Admitting: Internal Medicine

## 2011-04-20 DIAGNOSIS — N6320 Unspecified lump in the left breast, unspecified quadrant: Secondary | ICD-10-CM

## 2011-04-27 LAB — COMPREHENSIVE METABOLIC PANEL
ALT: 15
AST: 18
Albumin: 3.5
Alkaline Phosphatase: 35 — ABNORMAL LOW
Glucose, Bld: 118 — ABNORMAL HIGH
Potassium: 4
Sodium: 134 — ABNORMAL LOW
Total Protein: 6.6

## 2011-04-27 LAB — CBC
Hemoglobin: 13.5
RDW: 12.8

## 2011-04-27 LAB — FERRITIN: Ferritin: 72 (ref 10–291)

## 2011-06-01 ENCOUNTER — Other Ambulatory Visit: Payer: Commercial Indemnity

## 2011-06-08 ENCOUNTER — Ambulatory Visit
Admission: RE | Admit: 2011-06-08 | Discharge: 2011-06-08 | Disposition: A | Discharge status: Home / Self Care | Payer: Commercial Indemnity | Source: Ambulatory Visit | Attending: Internal Medicine | Admitting: Internal Medicine

## 2011-06-08 DIAGNOSIS — N6320 Unspecified lump in the left breast, unspecified quadrant: Secondary | ICD-10-CM

## 2011-11-02 ENCOUNTER — Other Ambulatory Visit: Payer: Self-pay | Admitting: Internal Medicine

## 2011-11-02 DIAGNOSIS — Z1231 Encounter for screening mammogram for malignant neoplasm of breast: Secondary | ICD-10-CM

## 2011-11-30 ENCOUNTER — Ambulatory Visit
Admission: RE | Admit: 2011-11-30 | Discharge: 2011-11-30 | Disposition: A | Discharge status: Home / Self Care | Payer: Commercial Indemnity | Source: Ambulatory Visit | Attending: Internal Medicine | Admitting: Internal Medicine

## 2011-11-30 DIAGNOSIS — Z1231 Encounter for screening mammogram for malignant neoplasm of breast: Secondary | ICD-10-CM

## 2012-01-21 ENCOUNTER — Other Ambulatory Visit (HOSPITAL_COMMUNITY): Payer: Self-pay | Admitting: Oncology

## 2012-01-25 ENCOUNTER — Encounter (HOSPITAL_COMMUNITY): Payer: Managed Care, Other (non HMO)

## 2012-01-26 ENCOUNTER — Encounter (HOSPITAL_COMMUNITY): Payer: Managed Care, Other (non HMO)

## 2012-02-05 ENCOUNTER — Encounter (HOSPITAL_COMMUNITY): Payer: Managed Care, Other (non HMO) | Attending: Oncology

## 2012-02-05 DIAGNOSIS — G600 Hereditary motor and sensory neuropathy: Secondary | ICD-10-CM | POA: Insufficient documentation

## 2012-02-05 DIAGNOSIS — Z09 Encounter for follow-up examination after completed treatment for conditions other than malignant neoplasm: Secondary | ICD-10-CM | POA: Insufficient documentation

## 2012-02-05 DIAGNOSIS — Z862 Personal history of diseases of the blood and blood-forming organs and certain disorders involving the immune mechanism: Secondary | ICD-10-CM | POA: Insufficient documentation

## 2012-02-05 LAB — CBC
HCT: 37.3 % (ref 36.0–46.0)
MCH: 28.3 pg (ref 26.0–34.0)
MCHC: 34.3 g/dL (ref 30.0–36.0)
MCV: 82.5 fL (ref 78.0–100.0)
RDW: 13 % (ref 11.5–15.5)

## 2012-02-05 NOTE — Progress Notes (Signed)
Labs drawn today for cbc,ferr 

## 2012-02-08 ENCOUNTER — Encounter (HOSPITAL_COMMUNITY): Payer: Self-pay | Admitting: Oncology

## 2012-02-08 ENCOUNTER — Encounter (HOSPITAL_BASED_OUTPATIENT_CLINIC_OR_DEPARTMENT_OTHER): Payer: Managed Care, Other (non HMO) | Admitting: Oncology

## 2012-02-08 VITALS — BP 121/59 | HR 74 | Temp 97.0°F | Wt 144.0 lb

## 2012-02-08 DIAGNOSIS — Z862 Personal history of diseases of the blood and blood-forming organs and certain disorders involving the immune mechanism: Secondary | ICD-10-CM

## 2012-02-08 DIAGNOSIS — G600 Hereditary motor and sensory neuropathy: Secondary | ICD-10-CM

## 2012-02-08 NOTE — Progress Notes (Signed)
Problem #1 iron deficiency anemia in the past which has been corrected and her ferritin continues to rise with 3 times a week iron which she will continue going forward. She admits today that she misses the third dose many times but gets in at least 2 doses per week. Her ferritin last year was 2858 which is excellent. CBC is perfectly normal now as well.  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 Fayrene Fearing love and she will make an appointment to see him in the near future.  Problem #3 pelvic cyst the right ovary benign she was released by Dr. Loree Fee with the idea that she would see Dr. Emelda Fear or his PA Victorino Dike and she will call for appointment.  #4 difficulty sleeping for which she uses, p.m. and Klonopin with good results  She is here today for followup at her blood work really is quite good. I've asked her to continue exactly what she is doing with the iron. We will check a CBC and ferritin in one year and see her after that.Marland Kitchen

## 2012-02-08 NOTE — Patient Instructions (Addendum)
[name]  [id number] 1940-12-11 Dr. Glenford Peers   [facility]  Discharge Instructions  RECOMMENDATIONS MADE BY THE CONSULTANT AND ANY TEST RESULTS WILL BE SENT TO YOUR REFERRING DOCTOR.   EXAM FINDINGS BY MD TODAY AND SIGNS AND SYMPTOMS TO REPORT TO CLINIC OR PRIMARY MD: exam and discussion by MD  MEDICATIONS PRESCRIBED: Continue iron 2 - 3 times a week   INSTRUCTIONS GIVEN AND DISCUSSED: Other :  Report increased ice intake, increased fatigue or shortness of breath.  SPECIAL INSTRUCTIONS/FOLLOW-UP: Lab work Needed in 1 year and Return to Clinic in 1 year.   I acknowledge that I have been informed and understand all the instructions given to me and received a copy. I do not have any more questions at this time, but understand that I may call the Specialty Clinic at [facility] at [phone] during business hours should I have any further questions or need assistance in obtaining follow-up care.    __________________________________________  _____________  __________ Signature of Patient or Authorized Representative            Date                   Time    __________________________________________ Nurse's Signature

## 2012-02-09 ENCOUNTER — Ambulatory Visit (HOSPITAL_COMMUNITY): Payer: Managed Care, Other (non HMO) | Admitting: Oncology

## 2012-05-19 ENCOUNTER — Other Ambulatory Visit: Payer: Self-pay | Admitting: Neurology

## 2012-05-19 ENCOUNTER — Ambulatory Visit
Admission: RE | Admit: 2012-05-19 | Discharge: 2012-05-19 | Disposition: A | Discharge status: Home / Self Care | Payer: Managed Care, Other (non HMO) | Source: Ambulatory Visit | Attending: Neurology | Admitting: Neurology

## 2012-05-19 DIAGNOSIS — G609 Hereditary and idiopathic neuropathy, unspecified: Secondary | ICD-10-CM | POA: Insufficient documentation

## 2012-05-19 DIAGNOSIS — M5412 Radiculopathy, cervical region: Secondary | ICD-10-CM

## 2012-05-19 DIAGNOSIS — E785 Hyperlipidemia, unspecified: Secondary | ICD-10-CM | POA: Insufficient documentation

## 2012-05-19 DIAGNOSIS — H8110 Benign paroxysmal vertigo, unspecified ear: Secondary | ICD-10-CM | POA: Insufficient documentation

## 2012-05-19 DIAGNOSIS — M810 Age-related osteoporosis without current pathological fracture: Secondary | ICD-10-CM | POA: Insufficient documentation

## 2012-08-09 ENCOUNTER — Other Ambulatory Visit (HOSPITAL_COMMUNITY): Payer: Managed Care, Other (non HMO)

## 2012-10-04 ENCOUNTER — Encounter: Payer: Self-pay | Admitting: Internal Medicine

## 2012-11-07 ENCOUNTER — Encounter (HOSPITAL_COMMUNITY): Payer: Self-pay | Admitting: Emergency Medicine

## 2012-11-07 ENCOUNTER — Emergency Department (HOSPITAL_COMMUNITY)
Admission: EM | Admit: 2012-11-07 | Discharge: 2012-11-07 | Disposition: A | Discharge status: Home / Self Care | Payer: Medicare Other | Attending: Emergency Medicine | Admitting: Emergency Medicine

## 2012-11-07 ENCOUNTER — Emergency Department (HOSPITAL_COMMUNITY): Payer: Medicare Other

## 2012-11-07 DIAGNOSIS — R05 Cough: Secondary | ICD-10-CM

## 2012-11-07 DIAGNOSIS — Z8701 Personal history of pneumonia (recurrent): Secondary | ICD-10-CM | POA: Insufficient documentation

## 2012-11-07 DIAGNOSIS — Z79899 Other long term (current) drug therapy: Secondary | ICD-10-CM | POA: Insufficient documentation

## 2012-11-07 DIAGNOSIS — J069 Acute upper respiratory infection, unspecified: Secondary | ICD-10-CM | POA: Insufficient documentation

## 2012-11-07 DIAGNOSIS — Z8669 Personal history of other diseases of the nervous system and sense organs: Secondary | ICD-10-CM | POA: Insufficient documentation

## 2012-11-07 DIAGNOSIS — I10 Essential (primary) hypertension: Secondary | ICD-10-CM | POA: Insufficient documentation

## 2012-11-07 DIAGNOSIS — R491 Aphonia: Secondary | ICD-10-CM | POA: Insufficient documentation

## 2012-11-07 DIAGNOSIS — D509 Iron deficiency anemia, unspecified: Secondary | ICD-10-CM | POA: Insufficient documentation

## 2012-11-07 DIAGNOSIS — J040 Acute laryngitis: Secondary | ICD-10-CM | POA: Insufficient documentation

## 2012-11-07 DIAGNOSIS — Z8781 Personal history of (healed) traumatic fracture: Secondary | ICD-10-CM | POA: Insufficient documentation

## 2012-11-07 DIAGNOSIS — Z862 Personal history of diseases of the blood and blood-forming organs and certain disorders involving the immune mechanism: Secondary | ICD-10-CM | POA: Insufficient documentation

## 2012-11-07 DIAGNOSIS — J3489 Other specified disorders of nose and nasal sinuses: Secondary | ICD-10-CM | POA: Insufficient documentation

## 2012-11-07 DIAGNOSIS — R059 Cough, unspecified: Secondary | ICD-10-CM | POA: Insufficient documentation

## 2012-11-07 LAB — CBC WITH DIFFERENTIAL/PLATELET
Basophils Absolute: 0.1 10*3/uL (ref 0.0–0.1)
Basophils Relative: 1 % (ref 0–1)
Eosinophils Absolute: 0.2 10*3/uL (ref 0.0–0.7)
Eosinophils Relative: 2 % (ref 0–5)
HCT: 34.7 % — ABNORMAL LOW (ref 36.0–46.0)
Hemoglobin: 12.3 g/dL (ref 12.0–15.0)
MCH: 28.6 pg (ref 26.0–34.0)
MCHC: 35.4 g/dL (ref 30.0–36.0)
MCV: 80.7 fL (ref 78.0–100.0)
Monocytes Absolute: 1.1 10*3/uL — ABNORMAL HIGH (ref 0.1–1.0)
Monocytes Relative: 9 % (ref 3–12)
RDW: 13 % (ref 11.5–15.5)

## 2012-11-07 LAB — COMPREHENSIVE METABOLIC PANEL
AST: 16 U/L (ref 0–37)
Albumin: 3.3 g/dL — ABNORMAL LOW (ref 3.5–5.2)
BUN: 14 mg/dL (ref 6–23)
Calcium: 9.4 mg/dL (ref 8.4–10.5)
Chloride: 102 mEq/L (ref 96–112)
Creatinine, Ser: 0.58 mg/dL (ref 0.50–1.10)
Total Bilirubin: 0.5 mg/dL (ref 0.3–1.2)

## 2012-11-07 MED ORDER — CEFTRIAXONE SODIUM 1 G IJ SOLR
1.0000 g | Freq: Once | INTRAMUSCULAR | Status: AC
  Administered 2012-11-07: 1 g via INTRAMUSCULAR
  Filled 2012-11-07: qty 10

## 2012-11-07 MED ORDER — AZITHROMYCIN 250 MG PO TABS: ORAL_TABLET | ORAL | Status: DC

## 2012-11-07 MED ORDER — LIDOCAINE HCL (PF) 1 % IJ SOLN
INTRAMUSCULAR | Status: AC
  Administered 2012-11-07: 2 mL
  Filled 2012-11-07: qty 5

## 2012-11-07 NOTE — ED Notes (Signed)
Cough, sore throat and hoarseness x 1 week. Unable to get in to see PCP today.

## 2012-11-07 NOTE — ED Notes (Signed)
Pt c/o URI with cough and hoarse voice and cough x 1 week

## 2012-11-07 NOTE — ED Provider Notes (Signed)
History     CSN: [id number]  Arrival date & time 11/07/12  0865   First MD Initiated Contact with Patient 11/07/12 1110      Chief Complaint  Patient presents with  . URI  . Cough    (Consider location/radiation/quality/duration/timing/severity/associated sxs/prior treatment) HPI Comments: [name] is a 72 y/o F with PMHx of HTN, peripheral neuropathy, iron deficiency anemia presenting to the ED with cough, nasal congestion, and loss of voice x 1 week. Patient reported that cough is constant throughout the day, worse at night, mainly productive with phlegm described as yellowish in color. Reported rhinorrhea that is mildly yellowish in color, noted when patient blows her nose. Patient reported loss of voice, stated that she had pain with talking and then starting Saturday her voice became raspy and reported discomfort when talking. Patient reported taking Mucinex, Zyrtec, and Tylenol for relief with minimal success. Associated symptoms are decreased appetite. Denied dysphagia, odynphagia, gi symptoms, urinary symptoms, chest pain, shortness of breathe, difficulty breathing, fever, chills, ear complaint, eye complaints, weakness.   The history is provided by the patient. No language interpreter was used.    Past Medical History  Diagnosis Date  . Hypertension   . Pneumonia   . Broken foot     left leg and toe  . Iron deficiency anemia   . Hemoglobin low     and hemocrit    Past Surgical History  Procedure Laterality Date  . Abdominal hysterectomy  1979  . Total abdominal hysterectomy w/ bilateral salpingoophorectomy  1996  . Cholecystectomy    . Colonoscopy      cameria capsule  . Foot surgery  06/11/06    rt.    . Retroperitoneal cyst      drainage of  . Bilateral oophorectomy      1996    Family History  Problem Relation Age of Onset  . Adopted: Yes    History  Substance Use Topics  . Smoking status: Never Smoker   . Smokeless tobacco: Never Used  .  Alcohol Use: No    OB History   Grav Para Term Preterm Abortions TAB SAB Ect Mult Living                  Review of Systems  Constitutional: Positive for appetite change. Negative for fever and chills.  HENT: Positive for congestion, rhinorrhea and voice change. Negative for ear pain, sore throat, trouble swallowing, neck pain and neck stiffness.   Eyes: Negative for pain and visual disturbance.  Respiratory: Positive for cough. Negative for chest tightness and shortness of breath.   Cardiovascular: Negative for chest pain.  Gastrointestinal: Negative for nausea, vomiting, abdominal pain, diarrhea and constipation.  Genitourinary: Negative for dysuria, decreased urine volume and difficulty urinating.  Musculoskeletal: Negative for back pain and arthralgias.  Skin: Negative for rash.  Neurological: Negative for dizziness, weakness, light-headedness, numbness and headaches.  All other systems reviewed and are negative.    Allergies  Ciprofloxacin and Sulfonamide derivatives  Home Medications   Current Outpatient Rx  Name  Route  Sig  Dispense  Refill  . acetaminophen (TYLENOL) 500 MG tablet   Oral   Take by mouth every 6 (six) hours as needed for pain.         Marland Kitchen amLODipine-valsartan (EXFORGE) 5-160 MG per tablet   Oral   Take 1 tablet by mouth daily.         . cetirizine (ZYRTEC) 10 MG tablet  Oral   Take 10 mg by mouth daily.         . clonazePAM (KLONOPIN) 0.5 MG tablet   Oral   Take 0.5 mg by mouth daily as needed for anxiety.          Marland Kitchen dextromethorphan-guaiFENesin (MUCINEX DM) 30-600 MG per 12 hr tablet   Oral   Take 2 tablets by mouth every 12 (twelve) hours.         Marland Kitchen estradiol (ESTRACE) 0.5 MG tablet   Oral   Take 0.5 mg by mouth daily.           . iron polysaccharides (NIFEREX) 150 MG capsule   Oral   Take 150 mg by mouth 3 (three) times a week. Monday, Wednesday, and friday         . OVER THE COUNTER MEDICATION   Oral   Take 1 tablet  by mouth daily as needed (for cold and cough).         . pregabalin (LYRICA) 100 MG capsule   Oral   Take 100 mg by mouth 3 (three) times daily.           . risedronate (ACTONEL) 35 MG tablet   Oral   Take 150 mg by mouth every 30 (thirty) days. On the 30th of the month with water on empty stomach, nothing by mouth or lie down for next 30 minutes.         Marland Kitchen azithromycin (ZITHROMAX Z-PAK) 250 MG tablet      Take 2 tablets (500 mg) PO on day one, then take 1 tablet PO once daily for days 2 through 5   6 tablet   0   . Multiple Vitamins-Minerals (CENTRUM SILVER PO)   Oral   Take by mouth daily.             BP 134/72  Pulse 85  Temp(Src) 98.5 F (36.9 C) (Oral)  Resp 16  SpO2 96%  Physical Exam  Nursing note and vitals reviewed. Constitutional: She is oriented to person, place, and time. She appears well-developed and well-nourished. No distress.  Raspy voice, cracks every once in a while.   HENT:  Head: Normocephalic and atraumatic.  Right Ear: External ear normal.  Left Ear: External ear normal.  Mouth/Throat: Oropharynx is clear and moist. No oropharyngeal exudate.  Uvula midline, symmetrical elevation. Mild erythema noted to posterior oropharynx - negative exudate, enlarged tonsils, petechiae.    Eyes: Conjunctivae and EOM are normal. Pupils are equal, round, and reactive to light. Right eye exhibits no discharge. Left eye exhibits no discharge.  Neck: Normal range of motion. Neck supple. No tracheal deviation present.  Negative lymphadenopathy  Cardiovascular: Normal rate, regular rhythm and normal heart sounds.  Exam reveals no friction rub.   No murmur heard. Radial pulses 2+ bilaterally  Pulmonary/Chest: Effort normal and breath sounds normal. No respiratory distress. She has no wheezes. She has no rales. She exhibits no tenderness.  Abdominal: Soft. Bowel sounds are normal. She exhibits no distension and no mass. There is no tenderness. There is no  rebound and no guarding.  Musculoskeletal: Normal range of motion.  Lymphadenopathy:    She has no cervical adenopathy.  Neurological: She is alert and oriented to person, place, and time. No cranial nerve deficit. She exhibits normal muscle tone. Coordination normal.  Skin: Skin is warm and dry. No rash noted. She is not diaphoretic. No erythema.  Psychiatric: She has a normal mood and affect. Her behavior  is normal. Thought content normal.    ED Course  Procedures (including critical care time)  Labs Reviewed  CBC WITH DIFFERENTIAL - Abnormal; Notable for the following:    WBC 12.2 (*)    HCT 34.7 (*)    Neutro Abs 8.0 (*)    Monocytes Absolute 1.1 (*)    All other components within normal limits  COMPREHENSIVE METABOLIC PANEL - Abnormal; Notable for the following:    Glucose, Bld 100 (*)    Albumin 3.3 (*)    All other components within normal limits   Dg Chest 2 View  11/07/2012  *RADIOLOGY REPORT*  Clinical Data: 72 year old female with cough and sore throat. Upper respiratory tract infection.  CHEST - 2 VIEW  Comparison: 01/17/2010.  Findings: Mildly lower lung volumes.  Cardiac size and mediastinal contours are within normal limits.  Visualized tracheal air column is within normal limits.  No pneumothorax or pulmonary edema.  No pleural effusion.  Mild crowding lung markings at the bases.  No consolidation or confluent pulmonary opacity. No acute osseous abnormality identified.  Right upper quadrant surgical clips.  IMPRESSION: Mild lung base atelectasis.  Otherwise no acute cardiopulmonary abnormality.   Original Report Authenticated By: Erskine Speed, M.D.      1. URI (upper respiratory infection)   2. Laryngitis   3. Cough   4. Iron deficiency anemia       MDM  I personally evaluated and examined the patient. Patient calm and cooperative. Chest xray ordered to r/o pneumonia - mild atelectasis, negative acute pulmonary disease noted. CBC mild elevation of WBC (12.2),  trend of low Hct. CMP negative findings. Discussed labs and case with Dr. Ranae Palms. Dr. Ranae Palms assessed patient - recommended to treat patient prophylactically since husband recently diagnosed with CAP - follow-up as outpatient - reported that since afebrile, normotensive, non-tachycardic, alert and oriented patient does not warrant admission. Ceftriaxone 1 gram IM given in ED setting. Patient aseptic, non-toxic appearing, in no acute distress. Discharged patient. Suspected URI with exposure to CAP - will be treated prophylactically, with laryngitis. Discharged patient with antibiotics for infection coverage - discussed course and regimen of treatment. Discussed with patient to follow-up with PCP for re-check by the end of this week. Discussed with patient to stay hydrated, rest, rest the voice - laryngitis is viral in nature, no antibiotics used. Discussed with patient to continue home medications as prescribed. Discussed with patient to monitor symptoms and if symptoms are to worsen or change to report back to the ED. Patient agreed to plan of care, understood, all questions answered.           Raymon Mutton, PA-C 11/07/12 2215

## 2012-11-08 NOTE — ED Provider Notes (Signed)
Medical screening examination/treatment/procedure(s) were performed by non-physician practitioner and as supervising physician I was immediately available for consultation/collaboration.   Macyn Remmert, MD 11/08/12 1743 

## 2012-11-11 ENCOUNTER — Other Ambulatory Visit: Payer: Self-pay

## 2012-11-11 DIAGNOSIS — Z1231 Encounter for screening mammogram for malignant neoplasm of breast: Secondary | ICD-10-CM

## 2012-11-23 ENCOUNTER — Encounter (HOSPITAL_COMMUNITY): Payer: Medicare Other | Attending: Oncology

## 2012-11-23 DIAGNOSIS — Z862 Personal history of diseases of the blood and blood-forming organs and certain disorders involving the immune mechanism: Secondary | ICD-10-CM

## 2012-11-23 DIAGNOSIS — D509 Iron deficiency anemia, unspecified: Secondary | ICD-10-CM | POA: Insufficient documentation

## 2012-11-23 LAB — CBC
Hemoglobin: 12.6 g/dL (ref 12.0–15.0)
MCH: 28.8 pg (ref 26.0–34.0)
MCV: 82.2 fL (ref 78.0–100.0)
Platelets: 591 10*3/uL — ABNORMAL HIGH (ref 150–400)
RBC: 4.37 MIL/uL (ref 3.87–5.11)
WBC: 7.1 10*3/uL (ref 4.0–10.5)

## 2012-11-23 LAB — FERRITIN: Ferritin: 103 ng/mL (ref 10–291)

## 2012-11-23 NOTE — Progress Notes (Signed)
Labs drawn today for cbc,ferr 

## 2013-01-03 ENCOUNTER — Ambulatory Visit
Admission: RE | Admit: 2013-01-03 | Discharge: 2013-01-03 | Disposition: A | Discharge status: Home / Self Care | Payer: Medicare Other | Source: Ambulatory Visit

## 2013-01-03 DIAGNOSIS — Z1231 Encounter for screening mammogram for malignant neoplasm of breast: Secondary | ICD-10-CM

## 2013-01-16 ENCOUNTER — Encounter: Payer: Self-pay | Admitting: Neurology

## 2013-01-16 DIAGNOSIS — M810 Age-related osteoporosis without current pathological fracture: Secondary | ICD-10-CM

## 2013-01-16 DIAGNOSIS — E785 Hyperlipidemia, unspecified: Secondary | ICD-10-CM

## 2013-01-16 DIAGNOSIS — M5412 Radiculopathy, cervical region: Secondary | ICD-10-CM

## 2013-01-16 DIAGNOSIS — H8110 Benign paroxysmal vertigo, unspecified ear: Secondary | ICD-10-CM

## 2013-01-16 DIAGNOSIS — G609 Hereditary and idiopathic neuropathy, unspecified: Secondary | ICD-10-CM

## 2013-01-17 ENCOUNTER — Encounter: Payer: Self-pay | Admitting: Neurology

## 2013-01-17 ENCOUNTER — Ambulatory Visit (INDEPENDENT_AMBULATORY_CARE_PROVIDER_SITE_OTHER): Payer: Medicare Other | Admitting: Neurology

## 2013-01-17 VITALS — BP 120/65 | HR 74 | Ht 65.25 in | Wt 142.0 lb

## 2013-01-17 DIAGNOSIS — G609 Hereditary and idiopathic neuropathy, unspecified: Secondary | ICD-10-CM

## 2013-01-17 NOTE — Progress Notes (Signed)
Reason for visit: Peripheral neuropathy  [name] is an 72 y.o. female  History of present illness:  [name] is a 72 year old right-handed white female with a history of a peripheral neuropathy initially diagnosed in 26. 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 The patient has been on Lyrica taking 100 mg 3 times daily with good pain improvement. The patient does have mild gait instability, but she is able to ambulate without any assistive devices. The patient has had no falls since last seen. The patient did have right foot surgery for a protruding bone in August of 2013. The patient is on Medicare, and she hit the "donut hole" and she finds that the Lyrica is too expensive. The patient is interested in switching to gabapentin. The patient comes to this office for an evaluation. The patient believes that she is somewhat more weak with the hands over the last year or 2.  Past Medical History  Diagnosis Date  . Hypertension   . Pneumonia   . Broken foot     left leg and toe  . Iron deficiency anemia   . Hemoglobin low     and hemocrit  . Hyperlipidemia   . Peripheral neuropathy   . Prepyloric ulcer   . Osteoporosis   . Bronchitis   . Shingles     Right V1 distribution  . Benign positional vertigo     Past Surgical History  Procedure Laterality Date  . Abdominal hysterectomy  1979  . Total abdominal hysterectomy w/ bilateral salpingoophorectomy  1996  . Cholecystectomy    . Colonoscopy      cameria capsule  . Foot surgery  06/11/06    rt.    . Retroperitoneal cyst      drainage of  . Bilateral oophorectomy      1996  . Cataract extraction Bilateral     Family History  Problem Relation Age of Onset  . Adopted: Yes  . Family history unknown: Yes    Social history:  reports that she has never smoked. She has never used smokeless tobacco.  She reports that  drinks alcohol. She reports that she does not use illicit drugs.  Allergies:  Allergies  Allergen Reactions  . Ciprofloxacin Hives and Rash  . Sulfonamide Derivatives Hives and Rash    Medications:  Current Outpatient Prescriptions on File Prior to Visit  Medication Sig Dispense Refill  . acetaminophen (TYLENOL) 500 MG tablet Take by mouth every 6 (six) hours as needed for pain.      Marland Kitchen amLODipine-valsartan (EXFORGE) 5-160 MG per tablet Take 1 tablet by mouth daily.      . clonazePAM (KLONOPIN) 0.5 MG tablet Take 0.5 mg by mouth daily as needed for anxiety.       Marland Kitchen dextromethorphan-guaiFENesin (MUCINEX DM) 30-600 MG per 12 hr tablet Take 2 tablets by mouth every 12 (twelve) hours.      Marland Kitchen estradiol (ESTRACE) 0.5 MG tablet Take 0.5 mg by mouth daily.        . iron polysaccharides (NIFEREX) 150 MG capsule Take 150 mg by mouth 3 (three) times a week. Monday, Wednesday, and friday      . Multiple Vitamins-Minerals (CENTRUM SILVER PO) Take by mouth daily.        . risedronate (ACTONEL) 35 MG tablet Take 150 mg by mouth every 30 (thirty) days. On the 30th of  the month with water on empty stomach, nothing by mouth or lie down for next 30 minutes.       No current facility-administered medications on file prior to visit.    ROS:  Out of a complete 14 system review of symptoms, the patient complains only of the following symptoms, and all other reviewed systems are negative.  Joint swelling, achy muscles Numbness, weakness Insomnia, decreased energy  Blood pressure 120/65, pulse 74, height 5' 5.25" (1.657 m), weight 142 lb (64.411 kg).  Physical Exam  General: The patient is alert and cooperative at the time of the examination.  Skin: No significant peripheral edema is noted.   Neurologic Exam  Cranial nerves: Facial symmetry is present. Speech is normal, no aphasia or dysarthria is noted. Extraocular movements are full. Visual fields are full.  Motor: The patient  has good strength in all 4 extremities.  Coordination: The patient has good finger-nose-finger and heel-to-shin bilaterally.  Gait and station: The patient has a normal gait. Tandem gait is unsteady. Romberg is negative. No drift is seen.  Reflexes: Deep tendon reflexes are symmetric, but the ankle jerk reflexes are absent.   Assessment/Plan:  One. Peripheral neuropathy   2. Mild gait disturbance  3. Charcot joints, feet  The patient will be taken off of Lyrica, and she will be switched to gabapentin taking 400 mg 3 times daily. The patient will contact our office if this dose is not effective for her. The patient will otherwise followup in 6-8 months. In general, the patient is doing relatively well.  Marlan Palau MD 01/17/2013 7:24 PM  [facility] [address] [place], [place] [address]  Phone [phone] Fax [phone]

## 2013-02-06 ENCOUNTER — Other Ambulatory Visit (HOSPITAL_COMMUNITY): Payer: Managed Care, Other (non HMO)

## 2013-02-08 ENCOUNTER — Ambulatory Visit (HOSPITAL_COMMUNITY): Payer: Managed Care, Other (non HMO)

## 2013-06-02 ENCOUNTER — Telehealth: Payer: Self-pay | Admitting: General Practice

## 2013-06-02 NOTE — Telephone Encounter (Signed)
Patient called and wanted to know when was her last tcs.  She thought she had polyps on her last exam.

## 2013-06-05 NOTE — Telephone Encounter (Signed)
Routing to Ginger Walker to follow-up on when she needs to repeat her procedure.

## 2013-06-05 NOTE — Telephone Encounter (Signed)
I called and told her that the last TCS she had was April 2004. She would like to go ahead and get set up for one. Doris can we get her set up over the phone for her TCS. She stated that Tuesday morning would be a good time to call her.

## 2013-06-06 ENCOUNTER — Telehealth: Payer: Self-pay

## 2013-06-07 ENCOUNTER — Other Ambulatory Visit: Payer: Self-pay

## 2013-06-07 DIAGNOSIS — Z1211 Encounter for screening for malignant neoplasm of colon: Secondary | ICD-10-CM

## 2013-06-12 NOTE — Telephone Encounter (Signed)
See separate triage.  

## 2013-06-12 NOTE — Telephone Encounter (Signed)
Gastroenterology Pre-Procedure Review  Request Date: 06/07/2013 Requesting Physician: PT had received a letter earlier this year/ Dr. Jena Gauss LAST COLONOSCOPY 10/24/2002    NORMAL COLON  PATIENT REVIEW QUESTIONS: The patient responded to the following health history questions as indicated:    1. Diabetes Melitis: no 2. Joint replacements in the past 12 months: no 3. Major health problems in the past 3 months: no 4. Has an artificial valve or MVP: no 5. Has a defibrillator: no 6. Has been advised in past to take antibiotics in advance of a procedure like teeth cleaning: no    MEDICATIONS & ALLERGIES:    Patient reports the following regarding taking any blood thinners:   Plavix? no Aspirin? YES Coumadin? no  Patient confirms/reports the following medications:  Current Outpatient Prescriptions  Medication Sig Dispense Refill  . acetaminophen (TYLENOL) 500 MG tablet Take by mouth every 6 (six) hours as needed for pain.      Marland Kitchen amLODipine-valsartan (EXFORGE) 5-160 MG per tablet Take 1 tablet by mouth daily.      . clonazePAM (KLONOPIN) 0.5 MG tablet Take 0.5 mg by mouth daily as needed for anxiety.       Marland Kitchen dextromethorphan-guaiFENesin (MUCINEX DM) 30-600 MG per 12 hr tablet Take 2 tablets by mouth every 12 (twelve) hours.      Marland Kitchen estradiol (ESTRACE) 0.5 MG tablet Take 0.5 mg by mouth daily.        . iron polysaccharides (NIFEREX) 150 MG capsule Take 150 mg by mouth 3 (three) times a week. Monday, Wednesday, and friday      . Multiple Vitamins-Minerals (CENTRUM SILVER PO) Take by mouth daily.        . pregabalin (LYRICA) 100 MG capsule Take 100 mg by mouth 3 (three) times daily.      . risedronate (ACTONEL) 35 MG tablet Take 150 mg by mouth every 30 (thirty) days. On the 30th of the month with water on empty stomach, nothing by mouth or lie down for next 30 minutes.       No current facility-administered medications for this visit.    Patient confirms/reports the following allergies:   Allergies  Allergen Reactions  . Ciprofloxacin Hives and Rash  . Sulfonamide Derivatives Hives and Rash    No orders of the defined types were placed in this encounter.    AUTHORIZATION INFORMATION Primary Insurance:   ID #:   Group #:  Pre-Cert / Auth required: Pre-Cert / Auth #:   Secondary Insurance:   ID #: Group #:  Pre-Cert / Auth required:  Pre-Cert / Auth #:   SCHEDULE INFORMATION: Procedure has been scheduled as follows:  Date: 06/22/2013                 Time:  1:00 PM Location: [facility] [place] Short Stay  This Gastroenterology Pre-Precedure Review Form is being routed to the following provider(s): R. Roetta Sessions, MD

## 2013-06-12 NOTE — Telephone Encounter (Signed)
Ok to schedule.

## 2013-06-13 MED ORDER — PEG-KCL-NACL-NASULF-NA ASC-C 100 G PO SOLR: 1.0000 | ORAL | Status: DC

## 2013-06-13 NOTE — Telephone Encounter (Signed)
Rx sent to the pharmacy and instructions mailed to pt.  

## 2013-06-15 ENCOUNTER — Encounter (HOSPITAL_COMMUNITY): Payer: Self-pay | Admitting: Pharmacy Technician

## 2013-06-22 ENCOUNTER — Ambulatory Visit (HOSPITAL_COMMUNITY)
Admission: RE | Admit: 2013-06-22 | Discharge: 2013-06-22 | Disposition: A | Discharge status: Home / Self Care | Payer: Medicare Other | Source: Ambulatory Visit | Attending: Internal Medicine | Admitting: Internal Medicine

## 2013-06-22 ENCOUNTER — Encounter (HOSPITAL_COMMUNITY)
Admission: RE | Disposition: A | Discharge status: Home / Self Care | Payer: Self-pay | Source: Ambulatory Visit | Attending: Internal Medicine

## 2013-06-22 ENCOUNTER — Encounter (HOSPITAL_COMMUNITY): Payer: Self-pay | Admitting: *Deleted

## 2013-06-22 DIAGNOSIS — K5730 Diverticulosis of large intestine without perforation or abscess without bleeding: Secondary | ICD-10-CM

## 2013-06-22 DIAGNOSIS — Z1211 Encounter for screening for malignant neoplasm of colon: Secondary | ICD-10-CM

## 2013-06-22 DIAGNOSIS — I10 Essential (primary) hypertension: Secondary | ICD-10-CM | POA: Insufficient documentation

## 2013-06-22 HISTORY — PX: COLONOSCOPY: SHX5424

## 2013-06-22 SURGERY — COLONOSCOPY
Anesthesia: Moderate Sedation

## 2013-06-22 MED ORDER — MIDAZOLAM HCL 5 MG/5ML IJ SOLN
INTRAMUSCULAR | Status: AC
  Filled 2013-06-22: qty 10

## 2013-06-22 MED ORDER — MEPERIDINE HCL 100 MG/ML IJ SOLN
INTRAMUSCULAR | Status: DC | PRN
  Administered 2013-06-22 (×2): 50 mg via INTRAVENOUS

## 2013-06-22 MED ORDER — STERILE WATER FOR IRRIGATION IR SOLN
Status: DC | PRN
  Administered 2013-06-22: 14:00:00

## 2013-06-22 MED ORDER — MIDAZOLAM HCL 5 MG/5ML IJ SOLN
INTRAMUSCULAR | Status: DC | PRN
  Administered 2013-06-22: 1 mg via INTRAVENOUS
  Administered 2013-06-22 (×2): 2 mg via INTRAVENOUS

## 2013-06-22 MED ORDER — ONDANSETRON HCL 4 MG/2ML IJ SOLN
INTRAMUSCULAR | Status: DC | PRN
  Administered 2013-06-22: 4 mg via INTRAVENOUS

## 2013-06-22 MED ORDER — MEPERIDINE HCL 100 MG/ML IJ SOLN
INTRAMUSCULAR | Status: AC
  Filled 2013-06-22: qty 2

## 2013-06-22 MED ORDER — SODIUM CHLORIDE 0.9 % IV SOLN
INTRAVENOUS | Status: DC
  Administered 2013-06-22: 12:00:00 via INTRAVENOUS

## 2013-06-22 MED ORDER — ONDANSETRON HCL 4 MG/2ML IJ SOLN
INTRAMUSCULAR | Status: AC
  Filled 2013-06-22: qty 2

## 2013-06-22 NOTE — Op Note (Signed)
[facility] [address] [place], [address]   COLONOSCOPY PROCEDURE REPORT  PATIENT: Jenna, [name]  MR#:         [id number] BIRTHDATE: 1941/01/24 , 72  yrs. old GENDER: Female ENDOSCOPIST: R.  Roetta Sessions, MD FACP FACG REFERRED BY:  Carylon Perches, M.D. PROCEDURE DATE:  06/22/2013 PROCEDURE:     Screening colonoscopy  INDICATIONS: Average risk colorectal cancer screening examination  INFORMED CONSENT:  The risks, benefits, alternatives and imponderables including but not limited to bleeding, perforation as well as the possibility of a missed lesion have been reviewed.  The potential for biopsy, lesion removal, etc. have also been discussed.  Questions have been answered.  All parties agreeable. Please see the history and physical in the medical record for more information.  MEDICATIONS: Versed 5 mg IV and Demerol 100 mg IV in divided doses. Zofran 4 mg IV.  DESCRIPTION OF PROCEDURE:  After a digital rectal exam was performed, the EG-[id number] ([id number]) and [id number] ([id number]) colonoscope was advanced from the anus through the rectum and colon to the area of the cecum, ileocecal valve and appendiceal orifice. The cecum was deeply intubated.  These structures were well-seen and photographed for the record.  From the level of the cecum and ileocecal valve, the scope was slowly and cautiously withdrawn. The mucosal surfaces were carefully surveyed utilizing scope tip deflection to facilitate fold flattening as needed.  The scope was pulled down into the rectum where a thorough examination including retroflexion was performed.    FINDINGS:  Adequate preparation. Normal rectum. Few scattered pancolonic diverticula;  the remainder of the colonic mucosa appeared normal.  THERAPEUTIC / DIAGNOSTIC MANEUVERS PERFORMED:  None  COMPLICATIONS: None  CECAL WITHDRAWAL TIME:  8 minutes  IMPRESSION:   Colonic diverticulosis (minimal)  RECOMMENDATIONS:   I do not  recommend a future colonoscopy unless the patient develops symptoms.   _______________________________ eSigned:  R. Roetta Sessions, MD FACP [facility] 06/22/2013 2:11 PM   CC:

## 2013-06-22 NOTE — H&P (Signed)
Primary Care Physician:  Carylon Perches, MD Primary Gastroenterologist:  Dr. Jena Gauss  Pre-Procedure History & Physical: HPI:  [name] is a 72 y.o. female is here for a screening colonoscopy. Average risk screening examination. Negative colonoscopy 10 years ago. No bowel symptoms. Family history unknown-adopted. Past Medical History  Diagnosis Date  . Hypertension   . Pneumonia   . Broken foot     left leg and toe  . Iron deficiency anemia   . Hemoglobin low     and hemocrit  . Hyperlipidemia   . Peripheral neuropathy   . Prepyloric ulcer   . Osteoporosis   . Bronchitis   . Shingles     Right V1 distribution  . Benign positional vertigo     Past Surgical History  Procedure Laterality Date  . Abdominal hysterectomy  1979  . Total abdominal hysterectomy w/ bilateral salpingoophorectomy  1996  . Cholecystectomy    . Colonoscopy      cameria capsule  . Foot surgery  06/11/06    rt.    . Retroperitoneal cyst      drainage of  . Bilateral oophorectomy      1996  . Cataract extraction Bilateral     Prior to Admission medications   Medication Sig Start Date End Date Taking? Authorizing Provider  acetaminophen (TYLENOL) 500 MG tablet Take by mouth every 6 (six) hours as needed for pain.   Yes Historical Provider, MD  amLODipine-valsartan (EXFORGE) 5-160 MG per tablet Take 1 tablet by mouth daily.   Yes Historical Provider, MD  clonazePAM (KLONOPIN) 0.5 MG tablet Take 0.5 mg by mouth daily as needed for anxiety.    Yes Historical Provider, MD  estradiol (ESTRACE) 0.5 MG tablet Take 0.5 mg by mouth daily.     Yes Historical Provider, MD  iron polysaccharides (NIFEREX) 150 MG capsule Take 150 mg by mouth 3 (three) times a week. Monday, Wednesday, and friday   Yes Historical Provider, MD  Multiple Vitamins-Minerals (CENTRUM SILVER PO) Take by mouth daily.     Yes Historical Provider, MD  pregabalin (LYRICA) 100 MG capsule Take 100 mg by mouth 3 (three) times daily.   Yes  Historical Provider, MD  risedronate (ACTONEL) 35 MG tablet Take 150 mg by mouth every 30 (thirty) days. On the 30th of the month with water on empty stomach, nothing by mouth or lie down for next 30 minutes.   Yes Historical Provider, MD    Allergies as of 06/07/2013 - Review Complete 06/07/2013  Allergen Reaction Noted  . Ciprofloxacin Hives and Rash   . Sulfonamide derivatives Hives and Rash     Family History  Problem Relation Age of Onset  . Adopted: Yes    History   Social History  . Marital Status: Married    Spouse Name: N/A    Number of Children: 1  . Years of Education: N/A   Occupational History  . Retired    Social History Main Topics  . Smoking status: Never Smoker   . Smokeless tobacco: Never Used  . Alcohol Use: No  . Drug Use: No  . Sexual Activity: Not on file   Other Topics Concern  . Not on file   Social History Narrative  . No narrative on file    Review of Systems: See HPI, otherwise negative ROS  Physical Exam: BP 144/69  Pulse 74  Temp(Src) 97.7 F (36.5 C) (Oral)  Resp 18  Ht 5\' 7"  (1.702 m)  Wt 142 lb (  64.411 kg)  BMI 22.24 kg/m2  SpO2 97% General:   Alert,  Well-developed, well-nourished, pleasant and cooperative in NAD Head:  Normocephalic and atraumatic. Eyes:  Sclera clear, no icterus.   Conjunctiva pink. Ears:  Normal auditory acuity. Nose:  No deformity, discharge,  or lesions. Mouth:  No deformity or lesions, dentition normal. Neck:  Supple; no masses or thyromegaly. Lungs:  Clear throughout to auscultation.   No wheezes, crackles, or rhonchi. No acute distress. Heart:  Regular rate and rhythm; no murmurs, clicks, rubs,  or gallops. Abdomen:  Soft, nontender and nondistended. No masses, hepatosplenomegaly or hernias noted. Normal bowel sounds, without guarding, and without rebound.   Msk:  Symmetrical without gross deformities. Normal posture. Pulses:  Normal pulses noted. Extremities:  Without clubbing or  edema. Neurologic:  Alert and  oriented x4;  grossly normal neurologically. Skin:  Intact without significant lesions or rashes. Cervical Nodes:  No significant cervical adenopathy. Psych:  Alert and cooperative. Normal mood and affect.  Impression/Plan: [name] is now here to undergo a screening colonoscopy. Average risk screening examination  Risks, benefits, limitations, imponderables and alternatives regarding colonoscopy have been reviewed with the patient. Questions have been answered. All parties agreeable.

## 2013-06-26 ENCOUNTER — Encounter (HOSPITAL_COMMUNITY): Payer: Self-pay | Admitting: Internal Medicine

## 2013-08-04 ENCOUNTER — Ambulatory Visit: Payer: Medicare Other | Admitting: Neurology

## 2013-08-04 ENCOUNTER — Encounter: Payer: Self-pay | Admitting: Neurology

## 2013-08-04 ENCOUNTER — Ambulatory Visit (INDEPENDENT_AMBULATORY_CARE_PROVIDER_SITE_OTHER): Payer: Medicare Other | Admitting: Neurology

## 2013-08-04 VITALS — BP 144/64 | HR 72 | Wt 145.0 lb

## 2013-08-04 DIAGNOSIS — G609 Hereditary and idiopathic neuropathy, unspecified: Secondary | ICD-10-CM

## 2013-08-04 NOTE — Progress Notes (Signed)
Reason for visit: Peripheral neuropathy  [name] is an 73 y.o. female  History of present illness:  [name] is a 73 year old right-handed white female with a history of a peripheral neuropathy. The patient was given a trial on gabapentin, as Lyrica was too expensive. The patient found that the gabapentin did not work as well as the Lyrica, and she has switched back to this medication. The patient is doing well with the pain, with exception that on average once a month she may have lancinating pains in the feet. The patient recently had a minor fracture of her left great toe, and she is still having some redness and swelling around the toe. The patient does not have to use an assistive device for ambulation. The patient indicates that she has difficulty feeling that her bladder is full, and she just uses the bathroom on a scheduled basis. The patient has irritable bowel syndrome, but no problems with controlling the bowels. The patient will occasionally have dizziness when she stands up. The patient returns to this office for an evaluation.  Past Medical History  Diagnosis Date  . Hypertension   . Pneumonia   . Broken foot     left leg and toe  . Iron deficiency anemia   . Hemoglobin low     and hemocrit  . Hyperlipidemia   . Peripheral neuropathy   . Prepyloric ulcer   . Osteoporosis   . Bronchitis   . Shingles     Right V1 distribution  . Benign positional vertigo     Past Surgical History  Procedure Laterality Date  . Abdominal hysterectomy  1979  . Total abdominal hysterectomy w/ bilateral salpingoophorectomy  1996  . Cholecystectomy    . Colonoscopy      cameria capsule  . Foot surgery  06/11/06    rt.    . Retroperitoneal cyst      drainage of  . Bilateral oophorectomy      1996  . Cataract extraction Bilateral   . Colonoscopy N/A 06/22/2013    Procedure: COLONOSCOPY;  Surgeon: Daneil Dolin, MD;  Location: [facility];  Service: Endoscopy;   Laterality: N/A;  1:00 PM    Family History  Problem Relation Age of Onset  . Adopted: Yes    Social history:  reports that she has never smoked. She has never used smokeless tobacco. She reports that she does not drink alcohol or use illicit drugs.    Allergies  Allergen Reactions  . Ciprofloxacin Hives and Rash  . Sulfonamide Derivatives Hives and Rash    Medications:  Current Outpatient Prescriptions on File Prior to Visit  Medication Sig Dispense Refill  . acetaminophen (TYLENOL) 500 MG tablet Take by mouth every 6 (six) hours as needed for pain.      Marland Kitchen amLODipine-valsartan (EXFORGE) 5-160 MG per tablet Take 1 tablet by mouth daily.      . clonazePAM (KLONOPIN) 0.5 MG tablet Take 0.5 mg by mouth daily as needed for anxiety.       Marland Kitchen estradiol (ESTRACE) 0.5 MG tablet Take 0.5 mg by mouth daily.        . iron polysaccharides (NIFEREX) 150 MG capsule Take 150 mg by mouth 3 (three) times a week. Monday, Wednesday, and friday      . Multiple Vitamins-Minerals (CENTRUM SILVER PO) Take by mouth daily.        . pregabalin (LYRICA) 100 MG capsule Take 100 mg by mouth 3 (three)  times daily.       No current facility-administered medications on file prior to visit.    ROS:  Out of a complete 14 system review of symptoms, the patient complains only of the following symptoms, and all other reviewed systems are negative.  Ear pain Dizziness Joint pain  Blood pressure 144/64, pulse 72, weight 145 lb (65.772 kg).  Physical Exam  General: The patient is alert and cooperative at the time of the examination.  Skin: No significant peripheral edema is noted.   Neurologic Exam  Mental status: The patient is oriented x 3.  Cranial nerves: Facial symmetry is present. Speech is normal, no aphasia or dysarthria is noted. Extraocular movements are full. Visual fields are full.  Motor: The patient has good strength in all 4 extremities.  Sensory examination: Soft touch sensation is  symmetric on the face and arms, the patient cannot feel soft touch on the ankles.  Coordination: The patient has good finger-nose-finger and heel-to-shin bilaterally.  Gait and station: The patient has a normal gait. Tandem gait is slightly unsteady. Romberg is negative. No drift is seen.  Reflexes: Deep tendon reflexes are symmetric. Reflexes well-maintained with exception that the ankle jerk reflexes are absent.   Assessment/Plan:  1. Peripheral neuropathy  2. Mild gait instability  In general the patient is fairly well controlled with her pain with the neuropathy on Lyrica. If the lancinating pains become more frequent, addition of carbamazepine may be helpful. The patient will followup otherwise in 6-8 months.  Jenna Alexanders MD 08/04/2013 6:12 PM  [facility] [address] [place], [place] [address]  Phone [phone] Fax [phone]

## 2013-11-24 ENCOUNTER — Ambulatory Visit (INDEPENDENT_AMBULATORY_CARE_PROVIDER_SITE_OTHER): Payer: Medicare Other | Admitting: Adult Health

## 2013-11-24 ENCOUNTER — Encounter: Payer: Self-pay | Admitting: Adult Health

## 2013-11-24 VITALS — BP 150/76 | Ht 67.0 in | Wt 144.5 lb

## 2013-11-24 DIAGNOSIS — R1031 Right lower quadrant pain: Secondary | ICD-10-CM

## 2013-11-24 HISTORY — DX: Right lower quadrant pain: R10.31

## 2013-11-24 NOTE — Patient Instructions (Signed)
Return for [place] and see me Pelvic Pain, Female Female pelvic pain can be caused by many different things and start from a variety of places. Pelvic pain refers to pain that is located in the lower half of the abdomen and between your hips. The pain may occur over a short period of time (acute) or may be reoccurring (chronic). The cause of pelvic pain may be related to disorders affecting the female reproductive organs (gynecologic), but it may also be related to the bladder, kidney stones, an intestinal complication, or muscle or skeletal problems. Getting help right away for pelvic pain is important, especially if there has been severe, sharp, or a sudden onset of unusual pain. It is also important to get help right away because some types of pelvic pain can be life threatening.  CAUSES  Below are only some of the causes of pelvic pain. The causes of pelvic pain can be in one of several categories.   Gynecologic.  Pelvic inflammatory disease.  Sexually transmitted infection.  Ovarian cyst or a twisted ovarian ligament (ovarian torsion).  Uterine lining that grows outside the uterus (endometriosis).  Fibroids, cysts, or tumors.  Ovulation.  Pregnancy.  Pregnancy that occurs outside the uterus (ectopic pregnancy).  Miscarriage.  Labor.  Abruption of the placenta or ruptured uterus.  Infection.  Uterine infection (endometritis).  Bladder infection.  Diverticulitis.  Miscarriage related to a uterine infection (septic abortion).  Bladder.  Inflammation of the bladder (cystitis).  Kidney stone(s).  Gastrointenstinal.  Constipation.  Diverticulitis.  Neurologic.  Trauma.  Feeling pelvic pain because of mental or emotional causes (psychosomatic).  Cancers of the bowel or pelvis. EVALUATION  Your caregiver will want to take a careful history of your concerns. This includes recent changes in your health, a careful gynecologic history of your periods (menses), and a  sexual history. Obtaining your family history and medical history is also important. Your caregiver may suggest a pelvic exam. A pelvic exam will help identify the location and severity of the pain. It also helps in the evaluation of which organ system may be involved. In order to identify the cause of the pelvic pain and be properly treated, your caregiver may order tests. These tests may include:   A pregnancy test.  Pelvic ultrasonography.  An X-ray exam of the abdomen.  A urinalysis or evaluation of vaginal discharge.  Blood tests. HOME CARE INSTRUCTIONS   Only take over-the-counter or prescription medicines for pain, discomfort, or fever as directed by your caregiver.   Rest as directed by your caregiver.   Eat a balanced diet.   Drink enough fluids to make your urine clear or pale yellow, or as directed.   Avoid sexual intercourse if it causes pain.   Apply warm or cold compresses to the lower abdomen depending on which one helps the pain.   Avoid stressful situations.   Keep a journal of your pelvic pain. Write down when it started, where the pain is located, and if there are things that seem to be associated with the pain, such as food or your menstrual cycle.  Follow up with your caregiver as directed.  SEEK MEDICAL CARE IF:  Your medicine does not help your pain.  You have abnormal vaginal discharge. SEEK IMMEDIATE MEDICAL CARE IF:   You have heavy bleeding from the vagina.   Your pelvic pain increases.   You feel lightheaded or faint.   You have chills.   You have pain with urination or blood in your  urine.   You have uncontrolled diarrhea or vomiting.   You have a fever or persistent symptoms for more than 3 days.  You have a fever and your symptoms suddenly get worse.   You are being physically or sexually abused.  MAKE SURE YOU:  Understand these instructions.  Will watch your condition.  Will get help if you are not doing well  or get worse. Document Released: 05/26/2004 Document Revised: 12/29/2011 Document Reviewed: 10/19/2011 [facility] Patient Information 2014 [place], [place].

## 2013-11-24 NOTE — Progress Notes (Signed)
   Subjective:    Patient ID: [name], female    DOB: 1940-08-09, 73 y.o.   MRN: [id number]  HPI  Tayna is a 73 year old white female, married in complaining of pain RLQ that comes and goes and radiates down right leg.She has a history of right ovarian remnant, and has had it drained by Dr Aldean Ast in past..She was seen here  last 03/31/12, and had [place] that showed 1.4 x 0.9 x 0. 5 cm ovarian remnant.A CA 125 was drawn and was 10.3.No changes in bladder habits, has had some diarrhea.Gait stable but has neuropathy and charcot-marie tooth.She has had labs and physical with Dr Willey Blade this year.  Review of Systems See HPI Reviewed past medical,surgical, social and family history. Reviewed medications and allergies.     Objective:   Physical Exam BP 150/76  Ht 5\' 7"  (1.702 m)  Wt 144 lb 8 oz (65.545 kg)  BMI 22.63 kg/m2 Skin warm and dry.Pelvic: external genitalia is normal in appearance for age, vagina is atrophic and the cervix and uterus are absent, adnexa: no masses or tenderness noted on exam she declines rectal. Discussed will get [place] when she returns from vacation.     Assessment & Plan:  RLQ pain ? Related to right ovarian remnant Return in 10-12 days for [place] and see me Review handout on pelvic pain

## 2013-12-07 ENCOUNTER — Encounter: Payer: Self-pay | Admitting: Adult Health

## 2013-12-07 ENCOUNTER — Ambulatory Visit (INDEPENDENT_AMBULATORY_CARE_PROVIDER_SITE_OTHER): Payer: Medicare Other

## 2013-12-07 ENCOUNTER — Other Ambulatory Visit: Payer: Medicare Other

## 2013-12-07 ENCOUNTER — Ambulatory Visit (INDEPENDENT_AMBULATORY_CARE_PROVIDER_SITE_OTHER): Payer: Medicare Other | Admitting: Adult Health

## 2013-12-07 VITALS — BP 130/72 | Ht 67.0 in | Wt 146.0 lb

## 2013-12-07 DIAGNOSIS — R1031 Right lower quadrant pain: Secondary | ICD-10-CM

## 2013-12-07 NOTE — Progress Notes (Signed)
Subjective:     Patient ID: [name], female   DOB: 29-Nov-1940, 73 y.o.   MRN: [id number]  HPI [name] is a 73 year old white female in for [place], has ovarian remnant and had some RLQ pain. Feels more discomfort top right thigh now.Has neuropathy. Review of Systems See HPI Reviewed past medical,surgical, social and family history. Reviewed medications and allergies.     Objective:   Physical Exam BP 130/72  Ht 5\' 7"  (1.702 m)  Wt 146 lb (66.225 kg)  BMI 22.86 kg/m2reviewed [place] with pt. Uterus Surgically Absent Vaginal Cuff- appears WNL  Right ovary 0.9 x 0.8 x 0.5 cm,  Left ovary Surgically absent  Technician Comments:  Vag cuff appears WNL Rt ovarian remnant appears WNL, Lt adnexa appears WNL, no free fluid or adnexal masses noted  Discussed could be back related, see Dr Willey Blade      Assessment:     History RLQ pain, now more in thigh   Ovarian remnant Plan:     Follow up with Dr Willey Blade

## 2013-12-07 NOTE — Patient Instructions (Signed)
Follow up with Dr Willey Blade

## 2013-12-12 ENCOUNTER — Other Ambulatory Visit: Payer: Self-pay

## 2013-12-12 DIAGNOSIS — Z1231 Encounter for screening mammogram for malignant neoplasm of breast: Secondary | ICD-10-CM

## 2014-01-04 ENCOUNTER — Ambulatory Visit: Payer: Medicare Other

## 2014-01-17 ENCOUNTER — Ambulatory Visit
Admission: RE | Admit: 2014-01-17 | Discharge: 2014-01-17 | Disposition: A | Discharge status: Home / Self Care | Payer: Medicare Other | Source: Ambulatory Visit

## 2014-01-17 ENCOUNTER — Encounter (INDEPENDENT_AMBULATORY_CARE_PROVIDER_SITE_OTHER): Payer: Self-pay

## 2014-01-17 DIAGNOSIS — Z1231 Encounter for screening mammogram for malignant neoplasm of breast: Secondary | ICD-10-CM

## 2014-01-18 ENCOUNTER — Ambulatory Visit (INDEPENDENT_AMBULATORY_CARE_PROVIDER_SITE_OTHER): Payer: Medicare Other | Admitting: Otolaryngology

## 2014-01-18 DIAGNOSIS — H905 Unspecified sensorineural hearing loss: Secondary | ICD-10-CM

## 2014-02-01 ENCOUNTER — Ambulatory Visit (INDEPENDENT_AMBULATORY_CARE_PROVIDER_SITE_OTHER): Payer: Medicare Other | Admitting: Neurology

## 2014-02-01 ENCOUNTER — Encounter: Payer: Self-pay | Admitting: Neurology

## 2014-02-01 ENCOUNTER — Ambulatory Visit (INDEPENDENT_AMBULATORY_CARE_PROVIDER_SITE_OTHER): Payer: Medicare Other

## 2014-02-01 ENCOUNTER — Telehealth: Payer: Self-pay | Admitting: Neurology

## 2014-02-01 VITALS — BP 137/68 | HR 71 | Wt 146.0 lb

## 2014-02-01 DIAGNOSIS — G5600 Carpal tunnel syndrome, unspecified upper limb: Secondary | ICD-10-CM

## 2014-02-01 DIAGNOSIS — G63 Polyneuropathy in diseases classified elsewhere: Secondary | ICD-10-CM

## 2014-02-01 DIAGNOSIS — G5603 Carpal tunnel syndrome, bilateral upper limbs: Secondary | ICD-10-CM

## 2014-02-01 DIAGNOSIS — G609 Hereditary and idiopathic neuropathy, unspecified: Secondary | ICD-10-CM

## 2014-02-01 MED ORDER — PREGABALIN 150 MG PO CAPS: 150.0000 mg | ORAL_CAPSULE | Freq: Three times a day (TID) | ORAL | Status: DC

## 2014-02-01 NOTE — Procedures (Signed)
     HISTORY:  [name] is a 73 year old patient with a history of a peripheral neuropathy. She has noted recent onset of some weakness involving the right hand, difficulty using a fork or a pan. The patient is being evaluated for a possible carpal tunnel syndrome.  NERVE CONDUCTION STUDIES:  Nerve conduction studies were performed on both upper extremities. The distal motor latencies for the median nerves were prolonged bilaterally, with normal motor amplitudes for these nerves bilaterally. The distal motor latencies for the ulnar nerves were normal on the right, significantly prolonged on the left, with low motor amplitudes for these nerves bilaterally. The F wave latencies for the median nerves were normal bilaterally, and was borderline normal for the right ulnar nerve, prolonged for the left ulnar nerve. The nerve conduction velocities for the median and ulnar nerves were normal bilaterally. The sensory latencies for the median and ulnar nerves were prolonged bilaterally.  Nerve conduction studies were performed on the right lower extremity. No response was seen for the right peroneal or right posterior tibial nerve. The right peroneal sensory latency was unobtainable.  EMG STUDIES:  EMG evaluation was not performed.  IMPRESSION:  Nerve conduction studies done on both upper extremities and on the right lower chimney shows evidence of a significant underlying peripheral neuropathy of axonal type. 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 No other significant abnormalities were seen.  Jill Alexanders MD 02/01/2014 12:25 PM  [facility] [address] [place], [place] [address]  Phone [phone] Fax [phone]

## 2014-02-01 NOTE — Patient Instructions (Signed)

## 2014-02-01 NOTE — Progress Notes (Signed)
Reason for visit: Peripheral neuropathy  Jenna [name] is an 73 y.o. female  History of present illness:  Jenna [name] is a 73 year old right-handed [name] female with a history of a peripheral neuropathy. She has a mild gait disturbance, but generally she does well. She has had a recent fall when she was going downstairs carrying laundry. She does not use a cane or a walker for ambulation. She is having some occasional lancinating pains in the legs. She is on Lyrica, taking 100 mg twice daily with some benefit, but the lancinating pains are now occurring on average once a week, and are more bothersome for her. The patient is also having occasional episodes of dizziness off and on, usually when she is out of the house, at a restaurant. She will have palpitations of the heart, she will get dizzy as if she might pass out, and she will have to lean over and get her head down. The episode only lasts a few moments. The episodes have been going on for about a year. The last episode occurred 2 months ago. The patient has never blacked out with these events. She returns to this office for an evaluation. She also reports some issues with using her right hand with handwriting, she is having increasing problems holding the pen.  Past Medical History  Diagnosis Date  . Hypertension   . Pneumonia   . Broken foot     left leg and toe  . Iron deficiency anemia   . Hemoglobin low     and hemocrit  . Hyperlipidemia   . Peripheral neuropathy   . Prepyloric ulcer   . Osteoporosis   . Bronchitis   . Shingles     Right V1 distribution  . Benign positional vertigo   . Charcot Marie Tooth muscular atrophy   . RLQ abdominal pain 11/24/2013    Hx of ovarian remnant ,now has pain down right leg  Will get [place]    Past Surgical History  Procedure Laterality Date  . Abdominal hysterectomy  1979  . Total abdominal hysterectomy w/ bilateral salpingoophorectomy  1996  . Cholecystectomy    . Colonoscopy     cameria capsule  . Foot surgery  06/11/06    rt.    . Retroperitoneal cyst      drainage of  . Bilateral oophorectomy      1996  . Cataract extraction Bilateral   . Colonoscopy N/A 06/22/2013    Procedure: COLONOSCOPY;  Surgeon: Daneil Dolin, MD;  Location: [facility];  Service: Endoscopy;  Laterality: N/A;  1:00 PM  . Mastoidectomy      Family History  Problem Relation Age of Onset  . Adopted: Yes    Social history:  reports that she has never smoked. She has never used smokeless tobacco. She reports that she does not drink alcohol or use illicit drugs.    Allergies  Allergen Reactions  . Ciprofloxacin Hives and Rash  . Sulfonamide Derivatives Hives and Rash    Medications:  Current Outpatient Prescriptions on File Prior to Visit  Medication Sig Dispense Refill  . acetaminophen (TYLENOL) 500 MG tablet Take by mouth every 6 (six) hours as needed for pain.      Marland Kitchen amLODipine (NORVASC) 5 MG tablet Take 5 mg by mouth daily.      Marland Kitchen amLODipine-valsartan (EXFORGE) 5-160 MG per tablet Take 1 tablet by mouth daily.      . clonazePAM (KLONOPIN) 0.5 MG tablet Take 0.5  mg by mouth daily as needed for anxiety.       . iron polysaccharides (NIFEREX) 150 MG capsule Take 150 mg by mouth 3 (three) times a week. Monday, Wednesday, and friday      . Multiple Vitamins-Minerals (CENTRUM SILVER PO) Take by mouth daily.        . Omega-3 Fatty Acids (FISH OIL PO) Take by mouth daily.      . risedronate (ACTONEL) 150 MG tablet Take 150 mg by mouth every 30 (thirty) days. with water on empty stomach, nothing by mouth or lie down for next 30 minutes.       No current facility-administered medications on file prior to visit.    ROS:  Out of a complete 14 system review of symptoms, the patient complains only of the following symptoms, and all other reviewed systems are negative.  Light sensitivity Dizziness Anxiety Gait disturbance  Blood pressure 137/68, pulse 71, weight 146 lb (66.225  kg).  Physical Exam  General: The patient is alert and cooperative at the time of the examination.  Skin: No significant peripheral edema is noted.   Neurologic Exam  Mental status: The patient is oriented x 3.  Cranial nerves: Facial symmetry is present. Speech is normal, no aphasia or dysarthria is noted. Extraocular movements are full. Visual fields are full.  Motor: The patient has good strength in all 4 extremities.  Sensory examination: Soft touch sensation is symmetric on the face, arms, and legs.  Coordination: The patient has good finger-nose-finger and heel-to-shin bilaterally.  Gait and station: The patient has a slightly wide-based, unsteady gait. Tandem gait is unsteady. Romberg is negative. No drift is seen.  Reflexes: Deep tendon reflexes are symmetric, but are depressed.   Assessment/Plan:  One. Peripheral neuropathy  2. Gait disturbance  3. Dizziness, near syncope  We have discussed the possibility of having a prolonged cardiac monitor for the episodes of dizziness. These likely are cardiac events that she feels palpitations of the heart that correlate with the dizziness, and she feels better when she gets her head down. The patient wants to "hold off" on the cardiac monitoring study for now. The patient is having increasing problems using her right hand, we will check nerve conduction studies to rule out the possibility of carpal tunnel syndrome that could be treated. The patient will be increased on the Lyrica dose taking 150 mg twice daily to help improve the neuropathy pain, and the lancinating pains that are bothersome to her. She will followup in 6 months.  Jill Alexanders MD 02/01/2014 8:05 PM  [facility] [address] [place], [place] [address]  Phone [phone] Fax [phone]

## 2014-02-01 NOTE — Telephone Encounter (Signed)
I called patient. Nerve conduction studies do show evidence of a significant peripheral neuropathy, clearly progressed since 1992 when her last nerve conduction study occurred. The patient has bilateral carpal tunnel syndrome, and a distal left ulnar neuropathy. The patient should try a wrist splint on the right wrist over the next 2 months. If this is helpful, this will be continued if not, the patient could potentially benefit from a referral to a hand surgeon.

## 2014-05-14 ENCOUNTER — Encounter: Payer: Self-pay | Admitting: Neurology

## 2014-07-13 HISTORY — PX: BREAST EXCISIONAL BIOPSY: SUR124

## 2014-08-06 ENCOUNTER — Ambulatory Visit: Payer: Medicare Other | Admitting: Adult Health

## 2014-08-08 ENCOUNTER — Encounter: Payer: Self-pay | Admitting: Adult Health

## 2014-08-08 ENCOUNTER — Ambulatory Visit (INDEPENDENT_AMBULATORY_CARE_PROVIDER_SITE_OTHER): Payer: PPO | Admitting: Adult Health

## 2014-08-08 VITALS — BP 126/70 | HR 77 | Ht 67.0 in | Wt 152.5 lb

## 2014-08-08 DIAGNOSIS — G5603 Carpal tunnel syndrome, bilateral upper limbs: Secondary | ICD-10-CM

## 2014-08-08 DIAGNOSIS — G5602 Carpal tunnel syndrome, left upper limb: Secondary | ICD-10-CM

## 2014-08-08 DIAGNOSIS — G5601 Carpal tunnel syndrome, right upper limb: Secondary | ICD-10-CM

## 2014-08-08 DIAGNOSIS — G609 Hereditary and idiopathic neuropathy, unspecified: Secondary | ICD-10-CM

## 2014-08-08 NOTE — Patient Instructions (Signed)
Continue Lyrica.  If your symptoms worsen or you develop new symptoms please let us know.

## 2014-08-08 NOTE — Progress Notes (Signed)
I have read the note, and I agree with the clinical assessment and plan.  WILLIS,CHARLES KEITH   

## 2014-08-08 NOTE — Progress Notes (Signed)
PATIENT: [name] DOB: 14-Jan-1941  REASON FOR VISIT: follow up-peripheral neuropathy, bilateral carpal tunnel syndrome HISTORY FROM: patient  HISTORY OF PRESENT ILLNESS: [name] is a 74 year old female with a history of peripheral neuropathy and bilateral carpal tunnel syndrome. She returns today for follow-up. She is currently taking Lyrica 150 mg TID. She reports that her pain has been controlled with the increase in medication. She was advised to a wear a wrist splint on the right hand due to carpal tunnel syndrome. She state that she has not tried the braces. She doesn't feel that she needs them at this point. In the past she has had dizzy spells associated with heart palpitations that improved when she put her head down. Cardiac monitoring was recommended at the last visit but the patient deferred. She states that she has not had any of those episodes recently. Denies any new medical history since last seen.    HISTORY 02/01/14 ([name]): [name] is a 74 year old right-handed white female with a history of a peripheral neuropathy. She has a mild gait disturbance, but generally she does well. She has had a recent fall when she was going downstairs carrying laundry. She does not use a cane or a walker for ambulation. She is having some occasional lancinating pains in the legs. She is on Lyrica, taking 100 mg twice daily with some benefit, but the lancinating pains are now occurring on average once a week, and are more bothersome for her. The patient is also having occasional episodes of dizziness off and on, usually when she is out of the house, at a restaurant. She will have palpitations of the heart, she will get dizzy as if she might pass out, and she will have to lean over and get her head down. The episode only lasts a few moments. The episodes have been going on for about a year. The last episode occurred 2 months ago. The patient has never blacked out with these events. She  returns to this office for an evaluation. She also reports some issues with using her right hand with handwriting, she is having increasing problems holding the pen.  REVIEW OF SYSTEMS: Out of a complete 14 system review of symptoms, the patient complains only of the following symptoms, and all other reviewed systems are negative.  ALLERGIES: Allergies  Allergen Reactions  . Ciprofloxacin Hives and Rash  . Sulfonamide Derivatives Hives and Rash    HOME MEDICATIONS: Outpatient Prescriptions Prior to Visit  Medication Sig Dispense Refill  . acetaminophen (TYLENOL) 500 MG tablet Take by mouth every 6 (six) hours as needed for pain.    Marland Kitchen amLODipine (NORVASC) 5 MG tablet Take 5 mg by mouth daily.    Marland Kitchen amLODipine-valsartan (EXFORGE) 5-160 MG per tablet Take 1 tablet by mouth daily.    . clonazePAM (KLONOPIN) 0.5 MG tablet Take 0.5 mg by mouth daily as needed for anxiety.     . iron polysaccharides (NIFEREX) 150 MG capsule Take 150 mg by mouth 3 (three) times a week. Monday, Wednesday, and friday    . Multiple Vitamins-Minerals (CENTRUM SILVER PO) Take by mouth daily.      . Omega-3 Fatty Acids (FISH OIL PO) Take by mouth daily.    . pregabalin (LYRICA) 150 MG capsule Take 1 capsule (150 mg total) by mouth 3 (three) times daily. 90 capsule 5  . risedronate (ACTONEL) 150 MG tablet Take 150 mg by mouth every 30 (thirty) days. with water on empty stomach,  nothing by mouth or lie down for next 30 minutes.     No facility-administered medications prior to visit.    PAST MEDICAL HISTORY: Past Medical History  Diagnosis Date  . Hypertension   . Pneumonia   . Broken foot     left leg and toe  . Iron deficiency anemia   . Hemoglobin low     and hemocrit  . Hyperlipidemia   . Peripheral neuropathy   . Prepyloric ulcer   . Osteoporosis   . Bronchitis   . Shingles     Right V1 distribution  . Benign positional vertigo   . Charcot Marie Tooth muscular atrophy   . RLQ abdominal pain  11/24/2013    Hx of ovarian remnant ,now has pain down right leg  Will get [place]    PAST SURGICAL HISTORY: Past Surgical History  Procedure Laterality Date  . Abdominal hysterectomy  1979  . Total abdominal hysterectomy w/ bilateral salpingoophorectomy  1996  . Cholecystectomy    . Colonoscopy      cameria capsule  . Foot surgery  06/11/06    rt.    . Retroperitoneal cyst      drainage of  . Bilateral oophorectomy      1996  . Cataract extraction Bilateral   . Colonoscopy N/A 06/22/2013    Procedure: COLONOSCOPY;  Surgeon: Daneil Dolin, MD;  Location: [facility];  Service: Endoscopy;  Laterality: N/A;  1:00 PM  . Mastoidectomy      FAMILY HISTORY: Family History  Problem Relation Age of Onset  . Adopted: Yes       PHYSICAL EXAM  Filed Vitals:   08/08/14 1459  BP: 126/70  Pulse: 77  Height: 5\' 7"  (1.702 m)  Weight: 152 lb 8 oz (69.174 kg)   Body mass index is 23.88 kg/(m^2).  Generalized: Well developed, in no acute distress   Neurological examination  Mentation: Alert oriented to time, place, history taking. Follows all commands speech and language fluent Cranial nerve II-XII: Pupils were equal round reactive to light. Extraocular movements were full, visual field were full on confrontational test. Facial sensation and strength were normal. Uvula tongue midline. Head turning and shoulder shrug  were normal and symmetric. Motor: The motor testing reveals 5 over 5 strength of all 4 extremities. Good symmetric motor tone is noted throughout.  Sensory: Sensory testing is intact to soft touch on all 4 extremities. No evidence of extinction is noted.  Coordination: Cerebellar testing reveals good finger-nose-finger and heel-to-shin bilaterally.  Gait and station: gait is slightly wide-based. Tandem gait is unsteady. Romberg is negative. No drift is seen.  Reflexes: Deep tendon reflexes are symmetric and normal bilaterally.   DIAGNOSTIC DATA (LABS, IMAGING,  TESTING) - I reviewed patient records, labs, notes, testing and imaging myself where available.   ASSESSMENT AND PLAN 74 y.o. year old female  has a past medical history of Hypertension; Pneumonia; Broken foot; Iron deficiency anemia; Hemoglobin low; Hyperlipidemia; Peripheral neuropathy; Prepyloric ulcer; Osteoporosis; Bronchitis; Shingles; Benign positional vertigo; Charcot Marie Tooth muscular atrophy; and RLQ abdominal pain (11/24/2013). here with:  1. Peripheral neuropathy  Overall the patient is doing well. Her peripheral neuropathy has been controlled with Lyrica. She will continue taking Lyrica 150 mg 3 times a day. If her symptoms worsen or she develops new symptoms she should let us know. Otherwise she will follow up in 6 months or sooner if needed.   Ward Givens, MSN, NP-C 08/08/2014, 3:15 PM [facility]  [address], [place], [place] [address] [phone]  Note: This document was prepared with digital dictation and possible smart phrase technology. Any transcriptional errors that result from this process are unintentional.

## 2014-08-30 ENCOUNTER — Other Ambulatory Visit: Payer: Self-pay

## 2014-08-30 ENCOUNTER — Telehealth: Payer: Self-pay

## 2014-08-30 MED ORDER — PREGABALIN 150 MG PO CAPS: 150.0000 mg | ORAL_CAPSULE | Freq: Three times a day (TID) | ORAL | Status: AC

## 2014-08-30 NOTE — Telephone Encounter (Signed)
Called patient to inform Rx ready for pick up at front desk. No answer. Will try later.  

## 2014-09-03 NOTE — Telephone Encounter (Signed)
Tried to reach patient. No answer. Med at front desk for p/u.

## 2014-12-14 ENCOUNTER — Other Ambulatory Visit: Payer: Self-pay

## 2014-12-14 DIAGNOSIS — Z1231 Encounter for screening mammogram for malignant neoplasm of breast: Secondary | ICD-10-CM

## 2015-01-21 ENCOUNTER — Ambulatory Visit
Admission: RE | Admit: 2015-01-21 | Discharge: 2015-01-21 | Disposition: A | Discharge status: Home / Self Care | Payer: PPO | Source: Ambulatory Visit

## 2015-01-21 DIAGNOSIS — Z1231 Encounter for screening mammogram for malignant neoplasm of breast: Secondary | ICD-10-CM

## 2015-01-23 ENCOUNTER — Other Ambulatory Visit: Payer: Self-pay | Admitting: Internal Medicine

## 2015-01-23 DIAGNOSIS — R928 Other abnormal and inconclusive findings on diagnostic imaging of breast: Secondary | ICD-10-CM

## 2015-01-24 ENCOUNTER — Ambulatory Visit (INDEPENDENT_AMBULATORY_CARE_PROVIDER_SITE_OTHER): Payer: PPO | Admitting: Otolaryngology

## 2015-01-31 ENCOUNTER — Ambulatory Visit
Admission: RE | Admit: 2015-01-31 | Discharge: 2015-01-31 | Disposition: A | Discharge status: Home / Self Care | Payer: PPO | Source: Ambulatory Visit | Attending: Internal Medicine | Admitting: Internal Medicine

## 2015-01-31 ENCOUNTER — Other Ambulatory Visit: Payer: Self-pay | Admitting: Internal Medicine

## 2015-01-31 DIAGNOSIS — R928 Other abnormal and inconclusive findings on diagnostic imaging of breast: Secondary | ICD-10-CM

## 2015-01-31 DIAGNOSIS — R921 Mammographic calcification found on diagnostic imaging of breast: Secondary | ICD-10-CM

## 2015-02-05 ENCOUNTER — Ambulatory Visit
Admission: RE | Admit: 2015-02-05 | Discharge: 2015-02-05 | Disposition: A | Discharge status: Home / Self Care | Payer: PPO | Source: Ambulatory Visit | Attending: Internal Medicine | Admitting: Internal Medicine

## 2015-02-05 ENCOUNTER — Other Ambulatory Visit: Payer: Self-pay | Admitting: Internal Medicine

## 2015-02-05 DIAGNOSIS — R921 Mammographic calcification found on diagnostic imaging of breast: Secondary | ICD-10-CM

## 2015-02-12 ENCOUNTER — Ambulatory Visit (INDEPENDENT_AMBULATORY_CARE_PROVIDER_SITE_OTHER): Payer: PPO | Admitting: Adult Health

## 2015-02-12 ENCOUNTER — Encounter: Payer: Self-pay | Admitting: Adult Health

## 2015-02-12 VITALS — BP 120/66 | HR 71 | Ht 67.0 in | Wt 151.0 lb

## 2015-02-12 DIAGNOSIS — R269 Unspecified abnormalities of gait and mobility: Secondary | ICD-10-CM

## 2015-02-12 DIAGNOSIS — G609 Hereditary and idiopathic neuropathy, unspecified: Secondary | ICD-10-CM | POA: Diagnosis not present

## 2015-02-12 NOTE — Progress Notes (Signed)
PATIENT: [name] DOB: 1940/10/24  REASON FOR VISIT: follow up- peripheral neuropathy HISTORY FROM: patient  HISTORY OF PRESENT ILLNESS: [name] is a 74 year old female with a history of peripheral neuropathy and bilateral carpal tunnel syndrome. She returns today for follow-up. She states that the Lyrica 150 mg 3 times a day is controlling her pain. She states that she does not have any issues with a carpal tunnel syndrome. The patient states that at times she feels that her walking is "off." She denies any falls. She does not use an assistive device when ambulating. In the past she was diagnosed with Charcot joints in the feet. She states she's also has surgery on the left foot. The patient also states that she has had some irritability lately. She states that she tends to get agitated very easily. She was not sure if this is coming from the Lyrica. The patient does state that she does not want to come off of Lyrica because it is working so well. The patient is questioning whether we can prescribe Ambien for her sleep. She has followed with her primary care who has tried her on several medications. She returns today for an evaluation.  HISTORY 08/08/14: [name] is a 74 year old female with a history of peripheral neuropathy and bilateral carpal tunnel syndrome. She returns today for follow-up. She is currently taking Lyrica 150 mg TID. She reports that her pain has been controlled with the increase in medication. She was advised to a wear a wrist splint on the right hand due to carpal tunnel syndrome. She state that she has not tried the braces. She doesn't feel that she needs them at this point. In the past she has had dizzy spells associated with heart palpitations that improved when she put her head down. Cardiac monitoring was recommended at the last visit but the patient deferred. She states that she has not had any of those episodes recently. Denies any new medical history since  last seen.   HISTORY 02/01/14 (WILLIS): [name] is a 74 year old right-handed white female with a history of a peripheral neuropathy. She has a mild gait disturbance, but generally she does well. She has had a recent fall when she was going downstairs carrying laundry. She does not use a cane or a walker for ambulation. She is having some occasional lancinating pains in the legs. She is on Lyrica, taking 100 mg twice daily with some benefit, but the lancinating pains are now occurring on average once a week, and are more bothersome for her. The patient is also having occasional episodes of dizziness off and on, usually when she is out of the house, at a restaurant. She will have palpitations of the heart, she will get dizzy as if she might pass out, and she will have to lean over and get her head down. The episode only lasts a few moments. The episodes have been going on for about a year. The last episode occurred 2 months ago. The patient has never blacked out with these events. She returns to this office for an evaluation. She also reports some issues with using her right hand with handwriting, she is having increasing problems holding the pen.  REVIEW OF SYSTEMS: Out of a complete 14 system review of symptoms, the patient complains only of the following symptoms, and all other reviewed systems are negative.  Fatigue, numbness, agitation, insomnia  ALLERGIES: Allergies  Allergen Reactions  . Ciprofloxacin Hives and Rash  .  Sulfonamide Derivatives Hives and Rash    HOME MEDICATIONS: Outpatient Prescriptions Prior to Visit  Medication Sig Dispense Refill  . acetaminophen (TYLENOL) 500 MG tablet Take by mouth every 6 (six) hours as needed for pain.    Marland Kitchen amLODipine-valsartan (EXFORGE) 5-160 MG per tablet Take 1 tablet by mouth daily.    . clonazePAM (KLONOPIN) 0.5 MG tablet Take 0.5 mg by mouth daily as needed for anxiety.     . iron polysaccharides (NIFEREX) 150 MG capsule Take 150 mg by  mouth 3 (three) times a week. Monday, Wednesday, and friday    . Multiple Vitamins-Minerals (CENTRUM SILVER PO) Take by mouth daily.      . Omega-3 Fatty Acids (FISH OIL PO) Take by mouth daily.    . pregabalin (LYRICA) 150 MG capsule Take 1 capsule (150 mg total) by mouth 3 (three) times daily. 90 capsule 5  . risedronate (ACTONEL) 150 MG tablet Take 150 mg by mouth every 30 (thirty) days. with water on empty stomach, nothing by mouth or lie down for next 30 minutes.    Marland Kitchen amLODipine (NORVASC) 5 MG tablet Take 5 mg by mouth daily.     No facility-administered medications prior to visit.    PAST MEDICAL HISTORY: Past Medical History  Diagnosis Date  . Hypertension   . Pneumonia   . Broken foot     left leg and toe  . Iron deficiency anemia   . Hemoglobin low     and hemocrit  . Hyperlipidemia   . Peripheral neuropathy   . Prepyloric ulcer   . Osteoporosis   . Bronchitis   . Shingles     Right V1 distribution  . Benign positional vertigo   . Charcot Marie Tooth muscular atrophy   . RLQ abdominal pain 11/24/2013    Hx of ovarian remnant ,now has pain down right leg  Will get [place]    PAST SURGICAL HISTORY: Past Surgical History  Procedure Laterality Date  . Abdominal hysterectomy  1979  . Total abdominal hysterectomy w/ bilateral salpingoophorectomy  1996  . Cholecystectomy    . Colonoscopy      cameria capsule  . Foot surgery  06/11/06    rt.    . Retroperitoneal cyst      drainage of  . Bilateral oophorectomy      1996  . Cataract extraction Bilateral   . Colonoscopy N/A 06/22/2013    Procedure: COLONOSCOPY;  Surgeon: Daneil Dolin, MD;  Location: [facility];  Service: Endoscopy;  Laterality: N/A;  1:00 PM  . Mastoidectomy      FAMILY HISTORY: Family History  Problem Relation Age of Onset  . Adopted: Yes    SOCIAL HISTORY: History   Social History  . Marital Status: Married    Spouse Name: N/A  . Number of Children: 1  . Years of Education: college2    Occupational History  . Retired    Social History Main Topics  . Smoking status: Never Smoker   . Smokeless tobacco: Never Used  . Alcohol Use: No  . Drug Use: No  . Sexual Activity: Yes    Birth Control/ Protection: Surgical   Other Topics Concern  . Not on file   Social History Narrative      PHYSICAL EXAM  Filed Vitals:   02/12/15 1444  BP: 120/66  Pulse: 71  Height: 5\' 7"  (1.702 m)  Weight: 151 lb (68.493 kg)   Body mass index is 23.64 kg/(m^2).  Generalized:  Well developed, in no acute distress   Neurological examination  Mentation: Alert oriented to time, place, history taking. Follows all commands speech and language fluent Cranial nerve II-XII: Pupils were equal round reactive to light. Extraocular movements were full, visual field were full on confrontational test. Facial sensation and strength were normal. Uvula tongue midline. Head turning and shoulder shrug  were normal and symmetric. Motor: The motor testing reveals 5 over 5 strength of all 4 extremities. Good symmetric motor tone is noted throughout.  Sensory: Sensory testing is intact to soft touch on all 4 extremities. No evidence of extinction is noted.  Coordination: Cerebellar testing reveals good finger-nose-finger and heel-to-shin bilaterally.  Gait and station: Gait is wide-based. Tandem gait is unsteady. Romberg is negative. No drift is seen.  Reflexes: Deep tendon reflexes are symmetric and normal bilaterally.   DIAGNOSTIC DATA (LABS, IMAGING, TESTING) - I reviewed patient records, labs, notes, testing and imaging myself where available.     ASSESSMENT AND PLAN 74 y.o. year old female  has a past medical history of Hypertension; Pneumonia; Broken foot; Iron deficiency anemia; Hemoglobin low; Hyperlipidemia; Peripheral neuropathy; Prepyloric ulcer; Osteoporosis; Bronchitis; Shingles; Benign positional vertigo; Charcot Marie Tooth muscular atrophy; and RLQ abdominal pain (11/24/2013). here  with:  1. Peripheral neuropathy  Overall the patient has remained stable. She will continue on Lyrica 150 mg 3 times a day. I have encouraged the patient that if her gait or balance worsens she should let us know. She should also let us know if she begins to have falls. The patient should follow-up with her primary care provider regarding insomnia- according to the patient she has been tried on several medications. Patient verbalized understanding. If her symptoms worsen or she develops new symptoms she should let us know. She will follow-up in 6 months or sooner if needed.     Ward Givens, MSN, NP-C 02/12/2015, 2:53 PM [facility] [address] [place], [place] [address] [phone]  Note: This document was prepared with digital dictation and possible smart phrase technology. Any transcriptional errors that result from this process are unintentional.

## 2015-02-12 NOTE — Progress Notes (Signed)
I have read the note, and I agree with the clinical assessment and plan.  WILLIS,CHARLES KEITH   

## 2015-02-12 NOTE — Patient Instructions (Signed)
Continue Lyrica  If your symptoms worsen or you develop new symptoms please let us know.

## 2015-03-07 ENCOUNTER — Other Ambulatory Visit: Payer: Self-pay | Admitting: General Surgery

## 2015-03-07 DIAGNOSIS — R928 Other abnormal and inconclusive findings on diagnostic imaging of breast: Secondary | ICD-10-CM

## 2015-03-22 ENCOUNTER — Other Ambulatory Visit: Payer: Self-pay | Admitting: General Surgery

## 2015-03-22 DIAGNOSIS — R928 Other abnormal and inconclusive findings on diagnostic imaging of breast: Secondary | ICD-10-CM

## 2015-03-26 ENCOUNTER — Encounter (HOSPITAL_BASED_OUTPATIENT_CLINIC_OR_DEPARTMENT_OTHER): Payer: Self-pay | Admitting: *Deleted

## 2015-04-01 ENCOUNTER — Other Ambulatory Visit: Payer: Self-pay

## 2015-04-01 ENCOUNTER — Encounter (HOSPITAL_BASED_OUTPATIENT_CLINIC_OR_DEPARTMENT_OTHER)
Admission: RE | Admit: 2015-04-01 | Discharge: 2015-04-01 | Disposition: A | Discharge status: Home / Self Care | Payer: PPO | Source: Ambulatory Visit | Attending: General Surgery | Admitting: General Surgery

## 2015-04-01 ENCOUNTER — Ambulatory Visit
Admission: RE | Admit: 2015-04-01 | Discharge: 2015-04-01 | Disposition: A | Discharge status: Home / Self Care | Payer: PPO | Source: Ambulatory Visit | Attending: General Surgery | Admitting: General Surgery

## 2015-04-01 DIAGNOSIS — F419 Anxiety disorder, unspecified: Secondary | ICD-10-CM | POA: Diagnosis not present

## 2015-04-01 DIAGNOSIS — I10 Essential (primary) hypertension: Secondary | ICD-10-CM | POA: Diagnosis not present

## 2015-04-01 DIAGNOSIS — G600 Hereditary motor and sensory neuropathy: Secondary | ICD-10-CM | POA: Diagnosis not present

## 2015-04-01 DIAGNOSIS — R928 Other abnormal and inconclusive findings on diagnostic imaging of breast: Secondary | ICD-10-CM | POA: Diagnosis present

## 2015-04-01 DIAGNOSIS — K219 Gastro-esophageal reflux disease without esophagitis: Secondary | ICD-10-CM | POA: Diagnosis not present

## 2015-04-01 DIAGNOSIS — D249 Benign neoplasm of unspecified breast: Secondary | ICD-10-CM | POA: Diagnosis not present

## 2015-04-01 DIAGNOSIS — Z79899 Other long term (current) drug therapy: Secondary | ICD-10-CM | POA: Diagnosis not present

## 2015-04-01 DIAGNOSIS — Z8673 Personal history of transient ischemic attack (TIA), and cerebral infarction without residual deficits: Secondary | ICD-10-CM | POA: Diagnosis not present

## 2015-04-01 DIAGNOSIS — N6092 Unspecified benign mammary dysplasia of left breast: Secondary | ICD-10-CM | POA: Diagnosis not present

## 2015-04-01 LAB — CBC WITH DIFFERENTIAL/PLATELET
Basophils Absolute: 0.1 10*3/uL (ref 0.0–0.1)
Basophils Relative: 1 %
Eosinophils Absolute: 0.1 10*3/uL (ref 0.0–0.7)
Eosinophils Relative: 2 %
HEMATOCRIT: 37.4 % (ref 36.0–46.0)
HEMOGLOBIN: 12.7 g/dL (ref 12.0–15.0)
LYMPHS ABS: 2.5 10*3/uL (ref 0.7–4.0)
LYMPHS PCT: 37 %
MCH: 27.8 pg (ref 26.0–34.0)
MCHC: 34 g/dL (ref 30.0–36.0)
MCV: 81.8 fL (ref 78.0–100.0)
MONOS PCT: 8 %
Monocytes Absolute: 0.5 10*3/uL (ref 0.1–1.0)
NEUTROS ABS: 3.5 10*3/uL (ref 1.7–7.7)
NEUTROS PCT: 52 %
Platelets: 330 10*3/uL (ref 150–400)
RBC: 4.57 MIL/uL (ref 3.87–5.11)
RDW: 13.6 % (ref 11.5–15.5)
WBC: 6.6 10*3/uL (ref 4.0–10.5)

## 2015-04-01 LAB — COMPREHENSIVE METABOLIC PANEL
ALK PHOS: 32 U/L — AB (ref 38–126)
ALT: 15 U/L (ref 14–54)
ANION GAP: 9 (ref 5–15)
AST: 21 U/L (ref 15–41)
Albumin: 3.7 g/dL (ref 3.5–5.0)
BILIRUBIN TOTAL: 0.8 mg/dL (ref 0.3–1.2)
BUN: 13 mg/dL (ref 6–20)
CALCIUM: 9.5 mg/dL (ref 8.9–10.3)
CO2: 28 mmol/L (ref 22–32)
CREATININE: 1.06 mg/dL — AB (ref 0.44–1.00)
Chloride: 107 mmol/L (ref 101–111)
GFR calc non Af Amer: 51 mL/min — ABNORMAL LOW (ref >60)
GFR, EST AFRICAN AMERICAN: 59 mL/min — AB (ref >60)
Glucose, Bld: 92 mg/dL (ref 65–99)
Potassium: 3.9 mmol/L (ref 3.5–5.1)
Sodium: 144 mmol/L (ref 135–145)
TOTAL PROTEIN: 6.3 g/dL — AB (ref 6.5–8.1)

## 2015-04-01 NOTE — H&P (Signed)
[name]  Location: [facility] Patient #: [id number] DOB: 01-29-1941 Married / Language: English / Race: White Female       History of Present Illness  The patient is a 74 year old female who presents with a complaint of abnormal mammogram left breast.  She is seen with her husband present.  She was referred by Dr. Melanee Spry at the breast center of [facility] for evaluation and management of an abnormal mammogram of the left breast.  Prior breast history reveals some needle guided biopsies in the past and also an open biopsy of the left breast at the very medial 9 o'clock position. Only benign disease. She has been followed every 6 months for some calcifications. A change was thought to have occurred. Her imaging studies showed category C density. Architectural distortion and calcifications in the left breast, upper midline. Ultrasound shows a 6 mm mass effect at the 11 o'clock position of the left breast, 1 cm from the nipple. Biopsy showed complex sclerosing lesion and calcification. The radiologist recommended excision and the patient was referred.  Comorbidities include Charcot-Marie-Tooth disease. 2 foot surgeries. Chronic neuropathy. Hypertension. Chronic anxiety on Klonopin  She is very much in favor of having this area conservatively excised. She will be scheduled for left breast lumpectomy with radioactive seed localization in the near future. I discussed the indications, details, techniques, and numerous risk of the surgery with the patient and her husband. They're aware of the risk of bleeding, infection, cosmetic deformity, nerve damage with chronic pain, reoperation for cancer, and other unforeseen problems. She understands these issues well. At this time all of her questions were answered. She is very much in favor of proceeding with this. We will plan to do this in mid September after they return from a vacation.   Other Problems   Cerebrovascular Accident Gastroesophageal Reflux Disease High blood pressure Oophorectomy  Past Surgical History Breast Biopsy Left. Foot Surgery Left. Gallbladder Surgery - Laparoscopic Hysterectomy (not due to cancer) - Complete Tonsillectomy  Diagnostic Studies History Colonoscopy 1-5 years ago Mammogram within last year Pap Smear >5 years ago  Allergies  Ciprofloxacin *CHEMICALS*  Medication History  Tylenol (500MG  Capsule, Oral) Active. Amlodipine-Valsartan-HCTZ (5-160-12.5MG  Tablet, Oral) Active. ClonazePAM (0.5MG  Tablet, Oral) Active. Niferex-150 (150MG  Capsule, Oral) Active. Multiple Vitamin (Oral) Active. Omega-3 Fish Oil (1000MG  Capsule, Oral) Active. Lyrica (150MG  Capsule, Oral) Active. Actonel (150MG  Tablet, Oral) Active. Medications Reconciled  Social History  Alcohol use Remotely quit alcohol use. Caffeine use Coffee, Tea. No drug use Tobacco use Never smoker.  Family History  Family history unknown First Degree Relatives  Pregnancy / Birth History  Age at menarche 42 years. Contraceptive History Oral contraceptives. Gravida 1 Maternal age 27-20 Para 1  Review of Systems  General Present- Fatigue. Not Present- Appetite Loss, Chills, Fever, Night Sweats, Weight Gain and Weight Loss. Skin Not Present- Change in Wart/Mole, Dryness, Hives, Jaundice, New Lesions, Non-Healing Wounds, Rash and Ulcer. HEENT Present- Earache. Not Present- Hearing Loss, Hoarseness, Nose Bleed, Oral Ulcers, Ringing in the Ears, Seasonal Allergies, Sinus Pain, Sore Throat, Visual Disturbances, Wears glasses/contact lenses and Yellow Eyes. Respiratory Not Present- Bloody sputum, Chronic Cough, Difficulty Breathing, Snoring and Wheezing. Breast Present- Breast Pain. Not Present- Breast Mass, Nipple Discharge and Skin Changes. Cardiovascular Present- Swelling of Extremities. Not Present- Chest Pain, Difficulty Breathing Lying Down, Leg Cramps,  Palpitations, Rapid Heart Rate and Shortness of Breath. Gastrointestinal Present- Chronic diarrhea and Indigestion. Not Present- Abdominal Pain, Bloating, Bloody Stool, Change in Bowel Habits,  Constipation, Difficulty Swallowing, Excessive gas, Gets full quickly at meals, Hemorrhoids, Nausea, Rectal Pain and Vomiting. Female Genitourinary Not Present- Frequency, Nocturia, Painful Urination, Pelvic Pain and Urgency. Musculoskeletal Present- Joint Pain, Muscle Weakness and Swelling of Extremities. Not Present- Back Pain, Joint Stiffness and Muscle Pain. Neurological Present- Numbness, Tingling, Trouble walking and Weakness. Not Present- Decreased Memory, Fainting, Headaches, Seizures and Tremor. Psychiatric Not Present- Anxiety, Bipolar, Change in Sleep Pattern, Depression, Fearful and Frequent crying. Endocrine Not Present- Cold Intolerance, Excessive Hunger, Hair Changes, Heat Intolerance, Hot flashes and New Diabetes. Hematology Not Present- Easy Bruising, Excessive bleeding, Gland problems, HIV and Persistent Infections.   Vitals  Weight: 148 lb Height: 67in Body Surface Area: 1.78 m Body Mass Index: 23.18 kg/m Temp.: 77F(Oral)  Pulse: 86 (Regular)  BP: 126/72 (Sitting, Left Arm, Standard)   Physical Exam General Mental Status-Alert. General Appearance-Consistent with stated age. Hydration-Well hydrated. Voice-Normal.  Head and Neck Head-normocephalic, atraumatic with no lesions or palpable masses. Trachea-midline. Thyroid Gland Characteristics - normal size and consistency.  Eye Eyeball - Bilateral-Extraocular movements intact. Sclera/Conjunctiva - Bilateral-No scleral icterus.  Chest and Lung Exam Chest and lung exam reveals -quiet, even and easy respiratory effort with no use of accessory muscles and on auscultation, normal breath sounds, no adventitious sounds and normal vocal resonance. Inspection Chest Wall - Normal. Back -  normal.  Breast Note: Breasts are medium size. Soft. Old transverse scar left breast at 9 o'clock position. Needle biopsy scar of her left breast. No other skin changes in either breast. No palpable mass. No axillary adenopathy.   Cardiovascular Cardiovascular examination reveals -normal heart sounds, regular rate and rhythm with no murmurs and normal pedal pulses bilaterally.  Abdomen Inspection Inspection of the abdomen reveals - No Hernias. Skin - Scar - Note: Pfannenstiel incision. Palpation/Percussion Palpation and Percussion of the abdomen reveal - Soft, Non Tender, No Rebound tenderness, No Rigidity (guarding) and No hepatosplenomegaly. Auscultation Auscultation of the abdomen reveals - Bowel sounds normal.  Neurologic Neurologic evaluation reveals -alert and oriented x 3 with no impairment of recent or remote memory. Mental Status-Normal.  Musculoskeletal Normal Exam - Left-Upper Extremity Strength Normal and Lower Extremity Strength Normal. Normal Exam - Right-Upper Extremity Strength Normal and Lower Extremity Strength Normal.  Lymphatic Head & Neck  General Head & Neck Lymphatics: Bilateral - Description - Normal. Axillary  General Axillary Region: Bilateral - Description - Normal. Tenderness - Non Tender. Femoral & Inguinal  Generalized Femoral & Inguinal Lymphatics: Bilateral - Description - Normal. Tenderness - Non Tender.    Assessment & Plan  ABNORMAL MAMMOGRAM OF LEFT BREAST (793.80  R92.8)   Schedule for Surgery The mammogram of her left breast shows some calcifications and a tiny mass just above the nipple and areola. The biopsy shows a complex sclerosing lesion and calcifications. This is not a cancer but that it could be early cancer nearby. Perhaps as much as 10% chance. He will be scheduled for left breast lumpectomy with radioactive seed localization We have discussed the techniques and risk of this surgery in detail Please read the  printed information that we have given you  Pt Education - [facility] Breast Biopsy HCI: discussed with patient and provided information. Pt Education - [facility] Breast Biopsy Instructions (FLB): discussed with patient and provided information.   CHARCOT-MARIE-TOOTH DISEASE (356.1  G60.0) Impression: History of 2 foot surgeries NEUROPATHY (355.9  G62.9) HYPERTENSION, BENIGN (401.1  I10) ANXIETY (300.00  F41.9) Impression: Takes clonazepam    Edsel Petrin. Dalbert Batman, M.D., [facility]  [facility] General and Minimally invasive Surgery Breast and Colorectal Surgery Office:   [phone] Pager:   [phone]

## 2015-04-02 ENCOUNTER — Ambulatory Visit
Admission: RE | Admit: 2015-04-02 | Discharge: 2015-04-02 | Disposition: A | Discharge status: Home / Self Care | Payer: PPO | Source: Ambulatory Visit | Attending: General Surgery | Admitting: General Surgery

## 2015-04-02 ENCOUNTER — Encounter (HOSPITAL_BASED_OUTPATIENT_CLINIC_OR_DEPARTMENT_OTHER)
Admission: RE | Disposition: A | Discharge status: Home / Self Care | Payer: Self-pay | Source: Ambulatory Visit | Attending: General Surgery

## 2015-04-02 ENCOUNTER — Encounter (HOSPITAL_BASED_OUTPATIENT_CLINIC_OR_DEPARTMENT_OTHER): Payer: Self-pay | Admitting: *Deleted

## 2015-04-02 ENCOUNTER — Ambulatory Visit (HOSPITAL_BASED_OUTPATIENT_CLINIC_OR_DEPARTMENT_OTHER): Payer: PPO | Admitting: Anesthesiology

## 2015-04-02 ENCOUNTER — Ambulatory Visit (HOSPITAL_BASED_OUTPATIENT_CLINIC_OR_DEPARTMENT_OTHER)
Admission: RE | Admit: 2015-04-02 | Discharge: 2015-04-02 | Disposition: A | Discharge status: Home / Self Care | Payer: PPO | Source: Ambulatory Visit | Attending: General Surgery | Admitting: General Surgery

## 2015-04-02 DIAGNOSIS — R928 Other abnormal and inconclusive findings on diagnostic imaging of breast: Secondary | ICD-10-CM

## 2015-04-02 DIAGNOSIS — N6092 Unspecified benign mammary dysplasia of left breast: Secondary | ICD-10-CM | POA: Insufficient documentation

## 2015-04-02 DIAGNOSIS — I10 Essential (primary) hypertension: Secondary | ICD-10-CM | POA: Diagnosis not present

## 2015-04-02 DIAGNOSIS — F419 Anxiety disorder, unspecified: Secondary | ICD-10-CM | POA: Insufficient documentation

## 2015-04-02 DIAGNOSIS — Z79899 Other long term (current) drug therapy: Secondary | ICD-10-CM | POA: Insufficient documentation

## 2015-04-02 DIAGNOSIS — G600 Hereditary motor and sensory neuropathy: Secondary | ICD-10-CM | POA: Insufficient documentation

## 2015-04-02 DIAGNOSIS — Z8673 Personal history of transient ischemic attack (TIA), and cerebral infarction without residual deficits: Secondary | ICD-10-CM | POA: Insufficient documentation

## 2015-04-02 DIAGNOSIS — D249 Benign neoplasm of unspecified breast: Secondary | ICD-10-CM | POA: Insufficient documentation

## 2015-04-02 DIAGNOSIS — K219 Gastro-esophageal reflux disease without esophagitis: Secondary | ICD-10-CM | POA: Insufficient documentation

## 2015-04-02 HISTORY — DX: Anxiety disorder, unspecified: F41.9

## 2015-04-02 HISTORY — PX: BREAST LUMPECTOMY WITH RADIOACTIVE SEED LOCALIZATION: SHX6424

## 2015-04-02 SURGERY — BREAST LUMPECTOMY WITH RADIOACTIVE SEED LOCALIZATION
Anesthesia: General | Site: Breast | Laterality: Left

## 2015-04-02 MED ORDER — MIDAZOLAM HCL 2 MG/ML PO SYRP: 0.5000 mg/kg | ORAL_SOLUTION | Freq: Once | ORAL | Status: DC

## 2015-04-02 MED ORDER — DEXAMETHASONE SODIUM PHOSPHATE 4 MG/ML IJ SOLN
INTRAMUSCULAR | Status: DC | PRN
  Administered 2015-04-02: 10 mg via INTRAVENOUS

## 2015-04-02 MED ORDER — LIDOCAINE HCL (CARDIAC) 20 MG/ML IV SOLN
INTRAVENOUS | Status: AC
  Filled 2015-04-02: qty 5

## 2015-04-02 MED ORDER — EPHEDRINE SULFATE 50 MG/ML IJ SOLN
INTRAMUSCULAR | Status: AC
  Filled 2015-04-02: qty 1

## 2015-04-02 MED ORDER — ATROPINE SULFATE 0.4 MG/ML IJ SOLN
INTRAMUSCULAR | Status: AC
  Filled 2015-04-02: qty 1

## 2015-04-02 MED ORDER — MEPERIDINE HCL 25 MG/ML IJ SOLN: 6.2500 mg | INTRAMUSCULAR | Status: DC | PRN

## 2015-04-02 MED ORDER — ONDANSETRON HCL 4 MG/2ML IJ SOLN
INTRAMUSCULAR | Status: DC | PRN
  Administered 2015-04-02: 4 mg via INTRAVENOUS

## 2015-04-02 MED ORDER — SUCCINYLCHOLINE CHLORIDE 20 MG/ML IJ SOLN
INTRAMUSCULAR | Status: AC
  Filled 2015-04-02: qty 1

## 2015-04-02 MED ORDER — DEXAMETHASONE SODIUM PHOSPHATE 10 MG/ML IJ SOLN
INTRAMUSCULAR | Status: AC
  Filled 2015-04-02: qty 1

## 2015-04-02 MED ORDER — FENTANYL CITRATE (PF) 100 MCG/2ML IJ SOLN
50.0000 ug | INTRAMUSCULAR | Status: DC | PRN
  Administered 2015-04-02: 100 ug via INTRAVENOUS

## 2015-04-02 MED ORDER — LIDOCAINE HCL (CARDIAC) 20 MG/ML IV SOLN
INTRAVENOUS | Status: DC | PRN
  Administered 2015-04-02: 50 mg via INTRAVENOUS

## 2015-04-02 MED ORDER — HYDROCODONE-ACETAMINOPHEN 5-325 MG PO TABS: 1.0000 | ORAL_TABLET | Freq: Four times a day (QID) | ORAL | Status: DC | PRN

## 2015-04-02 MED ORDER — OXYCODONE HCL 5 MG/5ML PO SOLN: 5.0000 mg | Freq: Once | ORAL | Status: DC | PRN

## 2015-04-02 MED ORDER — BUPIVACAINE-EPINEPHRINE (PF) 0.5% -1:200000 IJ SOLN
INTRAMUSCULAR | Status: AC
  Filled 2015-04-02: qty 30

## 2015-04-02 MED ORDER — OXYCODONE HCL 5 MG PO TABS: 5.0000 mg | ORAL_TABLET | Freq: Once | ORAL | Status: DC | PRN

## 2015-04-02 MED ORDER — HYDROMORPHONE HCL 1 MG/ML IJ SOLN: 0.2500 mg | INTRAMUSCULAR | Status: DC | PRN

## 2015-04-02 MED ORDER — CEFAZOLIN SODIUM-DEXTROSE 2-3 GM-% IV SOLR
INTRAVENOUS | Status: AC
  Filled 2015-04-02: qty 50

## 2015-04-02 MED ORDER — MIDAZOLAM HCL 2 MG/2ML IJ SOLN: 1.0000 mg | INTRAMUSCULAR | Status: DC | PRN

## 2015-04-02 MED ORDER — GLYCOPYRROLATE 0.2 MG/ML IJ SOLN: 0.2000 mg | Freq: Once | INTRAMUSCULAR | Status: DC | PRN

## 2015-04-02 MED ORDER — EPHEDRINE SULFATE 50 MG/ML IJ SOLN
INTRAMUSCULAR | Status: DC | PRN
  Administered 2015-04-02 (×3): 10 mg via INTRAVENOUS

## 2015-04-02 MED ORDER — LACTATED RINGERS IV SOLN
INTRAVENOUS | Status: DC
  Administered 2015-04-02: 07:00:00 via INTRAVENOUS

## 2015-04-02 MED ORDER — BUPIVACAINE-EPINEPHRINE (PF) 0.5% -1:200000 IJ SOLN
INTRAMUSCULAR | Status: DC | PRN
  Administered 2015-04-02: 7 mL via PERINEURAL

## 2015-04-02 MED ORDER — CEFAZOLIN SODIUM-DEXTROSE 2-3 GM-% IV SOLR
2.0000 g | INTRAVENOUS | Status: AC
  Administered 2015-04-02: 2 g via INTRAVENOUS

## 2015-04-02 MED ORDER — ONDANSETRON HCL 4 MG/2ML IJ SOLN
INTRAMUSCULAR | Status: AC
Start: 2015-04-02 — End: 2015-04-02
  Filled 2015-04-02: qty 2

## 2015-04-02 MED ORDER — PROPOFOL 500 MG/50ML IV EMUL
INTRAVENOUS | Status: AC
  Filled 2015-04-02: qty 50

## 2015-04-02 MED ORDER — SCOPOLAMINE 1 MG/3DAYS TD PT72: 1.0000 | MEDICATED_PATCH | Freq: Once | TRANSDERMAL | Status: DC | PRN

## 2015-04-02 MED ORDER — CHLORHEXIDINE GLUCONATE 4 % EX LIQD: 1.0000 "application " | Freq: Once | CUTANEOUS | Status: DC

## 2015-04-02 MED ORDER — PROPOFOL 10 MG/ML IV BOLUS
INTRAVENOUS | Status: DC | PRN
  Administered 2015-04-02: 150 mg via INTRAVENOUS

## 2015-04-02 MED ORDER — FENTANYL CITRATE (PF) 100 MCG/2ML IJ SOLN
INTRAMUSCULAR | Status: AC
  Filled 2015-04-02: qty 4

## 2015-04-02 SURGICAL SUPPLY — 62 items
APPLIER CLIP 9.375 MED OPEN (MISCELLANEOUS)
BENZOIN TINCTURE PRP APPL 2/3 (GAUZE/BANDAGES/DRESSINGS) IMPLANT
BINDER BREAST LRG (GAUZE/BANDAGES/DRESSINGS) ×3 IMPLANT
BINDER BREAST MEDIUM (GAUZE/BANDAGES/DRESSINGS) IMPLANT
BINDER BREAST XLRG (GAUZE/BANDAGES/DRESSINGS) IMPLANT
BINDER BREAST XXLRG (GAUZE/BANDAGES/DRESSINGS) IMPLANT
BLADE HEX COATED 2.75 (ELECTRODE) ×3 IMPLANT
BLADE SURG 10 STRL SS (BLADE) IMPLANT
BLADE SURG 15 STRL LF DISP TIS (BLADE) ×1 IMPLANT
BLADE SURG 15 STRL SS (BLADE) ×2
CANISTER SUC SOCK COL 7IN (MISCELLANEOUS) IMPLANT
CANISTER SUCT 1200ML W/VALVE (MISCELLANEOUS) ×3 IMPLANT
CHLORAPREP W/TINT 26ML (MISCELLANEOUS) ×3 IMPLANT
CLIP APPLIE 9.375 MED OPEN (MISCELLANEOUS) IMPLANT
CLOSURE WOUND 1/2 X4 (GAUZE/BANDAGES/DRESSINGS)
COVER BACK TABLE 60X90IN (DRAPES) ×3 IMPLANT
COVER MAYO STAND STRL (DRAPES) ×3 IMPLANT
COVER PROBE W GEL 5X96 (DRAPES) ×3 IMPLANT
DECANTER SPIKE VIAL GLASS SM (MISCELLANEOUS) IMPLANT
DERMABOND ADVANCED (GAUZE/BANDAGES/DRESSINGS) ×2
DERMABOND ADVANCED .7 DNX12 (GAUZE/BANDAGES/DRESSINGS) ×1 IMPLANT
DEVICE DUBIN W/COMP PLATE 8390 (MISCELLANEOUS) ×3 IMPLANT
DRAPE LAPAROSCOPIC ABDOMINAL (DRAPES) ×3 IMPLANT
DRAPE UTILITY XL STRL (DRAPES) ×3 IMPLANT
DRSG PAD ABDOMINAL 8X10 ST (GAUZE/BANDAGES/DRESSINGS) IMPLANT
ELECT REM PT RETURN 9FT ADLT (ELECTROSURGICAL) ×3
ELECTRODE REM PT RTRN 9FT ADLT (ELECTROSURGICAL) ×1 IMPLANT
GLOVE BIO SURGEON STRL SZ 6.5 (GLOVE) ×2 IMPLANT
GLOVE BIO SURGEONS STRL SZ 6.5 (GLOVE) ×1
GLOVE BIOGEL PI IND STRL 7.0 (GLOVE) ×1 IMPLANT
GLOVE BIOGEL PI INDICATOR 7.0 (GLOVE) ×2
GLOVE EUDERMIC 7 POWDERFREE (GLOVE) ×3 IMPLANT
GLOVE EXAM NITRILE EXT CUFF MD (GLOVE) ×3 IMPLANT
GOWN STRL REUS W/ TWL LRG LVL3 (GOWN DISPOSABLE) ×1 IMPLANT
GOWN STRL REUS W/ TWL XL LVL3 (GOWN DISPOSABLE) ×1 IMPLANT
GOWN STRL REUS W/TWL LRG LVL3 (GOWN DISPOSABLE) ×2
GOWN STRL REUS W/TWL XL LVL3 (GOWN DISPOSABLE) ×2
KIT MARKER MARGIN INK (KITS) ×3 IMPLANT
NEEDLE HYPO 25X1 1.5 SAFETY (NEEDLE) ×3 IMPLANT
NS IRRIG 1000ML POUR BTL (IV SOLUTION) ×3 IMPLANT
PACK BASIN DAY SURGERY FS (CUSTOM PROCEDURE TRAY) ×3 IMPLANT
PENCIL BUTTON HOLSTER BLD 10FT (ELECTRODE) ×3 IMPLANT
SHEET MEDIUM DRAPE 40X70 STRL (DRAPES) IMPLANT
SLEEVE SCD COMPRESS KNEE MED (MISCELLANEOUS) ×3 IMPLANT
SPONGE GAUZE 4X4 12PLY STER LF (GAUZE/BANDAGES/DRESSINGS) IMPLANT
SPONGE LAP 18X18 X RAY DECT (DISPOSABLE) IMPLANT
SPONGE LAP 4X18 X RAY DECT (DISPOSABLE) ×3 IMPLANT
STRIP CLOSURE SKIN 1/2X4 (GAUZE/BANDAGES/DRESSINGS) IMPLANT
SUT ETHILON 3 0 FSL (SUTURE) IMPLANT
SUT MNCRL AB 4-0 PS2 18 (SUTURE) ×3 IMPLANT
SUT SILK 2 0 SH (SUTURE) ×3 IMPLANT
SUT VIC AB 2-0 CT1 27 (SUTURE)
SUT VIC AB 2-0 CT1 TAPERPNT 27 (SUTURE) IMPLANT
SUT VIC AB 3-0 SH 27 (SUTURE)
SUT VIC AB 3-0 SH 27X BRD (SUTURE) IMPLANT
SUT VICRYL 3-0 CR8 SH (SUTURE) ×3 IMPLANT
SYRINGE 10CC LL (SYRINGE) ×3 IMPLANT
TOWEL OR 17X24 6PK STRL BLUE (TOWEL DISPOSABLE) ×3 IMPLANT
TOWEL OR NON WOVEN STRL DISP B (DISPOSABLE) IMPLANT
TUBE CONNECTING 20'X1/4 (TUBING) ×1
TUBE CONNECTING 20X1/4 (TUBING) ×2 IMPLANT
YANKAUER SUCT BULB TIP NO VENT (SUCTIONS) ×3 IMPLANT

## 2015-04-02 NOTE — Anesthesia Postprocedure Evaluation (Signed)
  Anesthesia Post-op Note  Patient: [name]  Procedure(s) Performed: Procedure(s): BREAST LUMPECTOMY WITH RADIOACTIVE SEED LOCALIZATION (Left)  Patient Location: PACU  Anesthesia Type: General   Level of Consciousness: awake, alert  and oriented  Airway and Oxygen Therapy: Patient Spontanous Breathing  Post-op Pain: mild  Post-op Assessment: Post-op Vital signs reviewed  Post-op Vital Signs: Reviewed  Last Vitals:  Filed Vitals:   04/02/15 0915  BP: 149/69  Pulse: 73  Temp: 36.4 C  Resp: 20    Complications: No apparent anesthesia complications

## 2015-04-02 NOTE — Transfer of Care (Signed)
Immediate Anesthesia Transfer of Care Note  Patient: [name]  Procedure(s) Performed: Procedure(s): BREAST LUMPECTOMY WITH RADIOACTIVE SEED LOCALIZATION (Left)  Patient Location: PACU  Anesthesia Type:General  Level of Consciousness: awake, alert  and oriented  Airway & Oxygen Therapy: Patient Spontanous Breathing and Patient connected to face mask oxygen  Post-op Assessment: Report given to RN and Post -op Vital signs reviewed and stable  Post vital signs: Reviewed and stable  Last Vitals:  Filed Vitals:   04/02/15 0634  BP: 137/68  Pulse: 67  Temp: 36.4 C  Resp: 20    Complications: No apparent anesthesia complications

## 2015-04-02 NOTE — Anesthesia Preprocedure Evaluation (Addendum)
Anesthesia Evaluation  Patient identified by MRN, date of birth, ID band Patient awake    Reviewed: Allergy & Precautions, NPO status , Patient's Chart, lab work & pertinent test results  Airway Mallampati: I  TM Distance: >3 FB Neck ROM: Full    Dental  (+) Teeth Intact, Dental Advisory Given   Pulmonary    breath sounds clear to auscultation       Cardiovascular hypertension, Pt. on medications  Rhythm:Regular Rate:Normal     Neuro/Psych    GI/Hepatic GERD  Medicated and Controlled,  Endo/Other    Renal/GU      Musculoskeletal   Abdominal   Peds  Hematology   Anesthesia Other Findings   Reproductive/Obstetrics                            Anesthesia Physical Anesthesia Plan  ASA: II  Anesthesia Plan: General   Post-op Pain Management:    Induction: Intravenous  Airway Management Planned: LMA  Additional Equipment:   Intra-op Plan:   Post-operative Plan: Extubation in OR  Informed Consent: I have reviewed the patients History and Physical, chart, labs and discussed the procedure including the risks, benefits and alternatives for the proposed anesthesia with the patient or authorized representative who has indicated his/her understanding and acceptance.   Dental advisory given  Plan Discussed with: CRNA and Anesthesiologist  Anesthesia Plan Comments:         Anesthesia Quick Evaluation

## 2015-04-02 NOTE — Interval H&P Note (Signed)
History and Physical Interval Note:  04/02/2015 7:03 AM  [name]  has presented today for surgery, with the diagnosis of abnormal mammogram left breast  The various methods of treatment have been discussed with the patient and family. After consideration of risks, benefits and other options for treatment, the patient has consented to  Procedure(s): BREAST LUMPECTOMY WITH RADIOACTIVE SEED LOCALIZATION (Left) as a surgical intervention .  The patient's history has been reviewed, patient examined, no change in status, stable for surgery.  I have reviewed the patient's chart and labs.  Questions were answered to the patient's satisfaction.     Adin Hector

## 2015-04-02 NOTE — Discharge Instructions (Signed)
[facility] °Office Phone Number [phone] ° °BREAST BIOPSY/ PARTIAL MASTECTOMY: POST OP INSTRUCTIONS ° °Always review your discharge instruction sheet given to you by the facility where your surgery was performed. ° °IF YOU HAVE DISABILITY OR FAMILY LEAVE FORMS, YOU MUST BRING THEM TO THE OFFICE FOR PROCESSING.  DO NOT GIVE THEM TO YOUR DOCTOR. ° °1. A prescription for pain medication may be given to you upon discharge.  Take your pain medication as prescribed, if needed.  If narcotic pain medicine is not needed, then you may take acetaminophen (Tylenol) or ibuprofen (Advil) as needed. °2. Take your usually prescribed medications unless otherwise directed °3. If you need a refill on your pain medication, please contact your pharmacy.  They will contact our office to request authorization.  Prescriptions will not be filled after 5pm or on week-ends. °4. You should eat very light the first 24 hours after surgery, such as soup, crackers, pudding, etc.  Resume your normal diet the day after surgery. °5. Most patients will experience some swelling and bruising in the breast.  Ice packs and a good support bra will help.  Swelling and bruising can take several days to resolve.  °6. It is common to experience some constipation if taking pain medication after surgery.  Increasing fluid intake and taking a stool softener will usually help or prevent this problem from occurring.  A mild laxative (Milk of Magnesia or Miralax) should be taken according to package directions if there are no bowel movements after 48 hours. °7. Unless discharge instructions indicate otherwise, you may remove your bandages 24-48 hours after surgery, and you may shower at that time.  You may have steri-strips (small skin tapes) in place directly over the incision.  These strips should be left on the skin for 7-10 days.  If your surgeon used skin glue on the incision, you may shower in 24 hours.  The glue will flake off over the  next 2-3 weeks.  Any sutures or staples will be removed at the office during your follow-up visit. °8. ACTIVITIES:  You may resume regular daily activities (gradually increasing) beginning the next day.  Wearing a good support bra or sports bra minimizes pain and swelling.  You may have sexual intercourse when it is comfortable. °a. You may drive when you no longer are taking prescription pain medication, you can comfortably wear a seatbelt, and you can safely maneuver your car and apply brakes. °b. RETURN TO WORK:  ______________________________________________________________________________________ °9. You should see your doctor in the office for a follow-up appointment approximately two weeks after your surgery.  Your doctor’s nurse will typically make your follow-up appointment when she calls you with your pathology report.  Expect your pathology report 2-3 business days after your surgery.  You may call to check if you do not hear from us after three days. °10. OTHER INSTRUCTIONS: _______________________________________________________________________________________________ _____________________________________________________________________________________________________________________________________ °_____________________________________________________________________________________________________________________________________ °_____________________________________________________________________________________________________________________________________ ° °WHEN TO CALL YOUR DOCTOR: °1. Fever over 101.0 °2. Nausea and/or vomiting. °3. Extreme swelling or bruising. °4. Continued bleeding from incision. °5. Increased pain, redness, or drainage from the incision. ° °The clinic staff is available to answer your questions during regular business hours.  Please don’t hesitate to call and ask to speak to one of the nurses for clinical concerns.  If you have a medical emergency, go to the nearest  emergency room or call 911.  A surgeon from [facility] is always on call at the hospital. ° °For further questions, please visit [email or web address]  ° ° ° °  Post Anesthesia Home Care Instructions ° °Activity: °Get plenty of rest for the remainder of the day. A responsible adult should stay with you for 24 hours following the procedure.  °For the next 24 hours, DO NOT: °-Drive a car °-Operate machinery °-Drink alcoholic beverages °-Take any medication unless instructed by your physician °-Make any legal decisions or sign important papers. ° °Meals: °Start with liquid foods such as gelatin or soup. Progress to regular foods as tolerated. Avoid greasy, spicy, heavy foods. If nausea and/or vomiting occur, drink only clear liquids until the nausea and/or vomiting subsides. Call your physician if vomiting continues. ° °Special Instructions/Symptoms: °Your throat may feel dry or sore from the anesthesia or the breathing tube placed in your throat during surgery. If this causes discomfort, gargle with warm salt water. The discomfort should disappear within 24 hours. ° °If you had a scopolamine patch placed behind your ear for the management of post- operative nausea and/or vomiting: ° °1. The medication in the patch is effective for 72 hours, after which it should be removed.  Wrap patch in a tissue and discard in the trash. Wash hands thoroughly with soap and water. °2. You may remove the patch earlier than 72 hours if you experience unpleasant side effects which may include dry mouth, dizziness or visual disturbances. °3. Avoid touching the patch. Wash your hands with soap and water after contact with the patch. °  ° °

## 2015-04-02 NOTE — Anesthesia Procedure Notes (Signed)
Procedure Name: LMA Insertion Date/Time: 04/02/2015 7:25 AM Performed by: Melynda Ripple D Pre-anesthesia Checklist: Patient identified, Emergency Drugs available, Suction available and Patient being monitored Patient Re-evaluated:Patient Re-evaluated prior to inductionOxygen Delivery Method: Circle System Utilized Preoxygenation: Pre-oxygenation with 100% oxygen Intubation Type: IV induction Ventilation: Mask ventilation without difficulty LMA: LMA inserted LMA Size: 4.0 Number of attempts: 1 Airway Equipment and Method: Bite block Placement Confirmation: positive ETCO2 Tube secured with: Tape Dental Injury: Teeth and Oropharynx as per pre-operative assessment

## 2015-04-02 NOTE — Op Note (Signed)
  Patient Name:           MARYROSE COLVIN   Date of Surgery:        04/02/2015  Pre op Diagnosis:      Abnormal mammogram left breast  Post op Diagnosis:    Same  Procedure:                 Left breast lumpectomy with radioactive seed localization  Surgeon:                     Edsel Petrin. Dalbert Batman, M.D., FACS  Assistant:                      OR staff  Operative Indications:   The patient is a 74 year old female who presents with a complaint of abnormal mammogram left breast.  She was referred by Dr. Melanee Spry at the breast center of [facility] for evaluation and management of an abnormal mammogram of the left breast. Prior breast history reveals some needle guided biopsies in the past and also an open biopsy of the left breast at the very medial 9 o'clock position. Only benign disease. She has been followed every 6 months for some calcifications. A change was thought to have occurred. Her imaging studies showed category C density. Architectural distortion and calcifications in the left breast, upper midline. Ultrasound shows a 6 mm mass effect at the 11 o'clock position of the left breast, 1 cm from the nipple. Biopsy showed complex sclerosing lesion and calcification. The radiologist recommended excision and the patient was referred. She is very much in favor of having this area conservatively excised. She will be scheduled for left breast lumpectomy with radioactive seed localization in the near future.   Operative Findings:       The radioactive seed was placed yesterday, and the seed and marker clip are adjacent to each other in the central left breast, retroareolar area, 12:00 position.  Middle depth.  The radioactive signal was detected in the holding area with the neoprobe.  The specimen mammogram looked good with the marker clip and the radioactive seed in the center of the specimen.  Procedure in Detail:          Following the induction of general LMA anesthesia the patient's left  breast was prepped and draped in a sterile fashion.  Surgical timeout was performed.  Intravenous antibiotics were given.  Using the neoprobe I found that the maximum radioactive counts was at the superior areolar margin at the 12:00 position.  0.5% Marcaine with epinephrine was used as local infiltration anesthesia.  A curvilinear incision was made at the areolar margin superiorly.  I dissected down into the breast tissue using the neoprobe frequently I dissected around the radioactive signal.  The specimen was removed and marked with silk sutures and a 6 color ink kit to orient the pathologist.  Specimen mammogram looked very good as described above.  The specimen was sent to pathology.  Hemostasis was excellent and achieved electrocautery.  After irrigating the wound I closed the breast tissues in several layers with interrupted 3-0 Vicryl and skin was closed with a running subcuticular 4-0 Monocryl and Dermabond.  Breast binder was placed in the patient taken to PACU in stable condition.  EBL 10 mL.  Counts correct.  Complications none.     Edsel Petrin. Dalbert Batman, M.D., FACS General and Minimally Invasive Surgery Breast and Colorectal Surgery  04/02/2015 8:20 AM

## 2015-04-03 ENCOUNTER — Encounter (HOSPITAL_BASED_OUTPATIENT_CLINIC_OR_DEPARTMENT_OTHER): Payer: Self-pay | Admitting: General Surgery

## 2015-04-04 NOTE — Progress Notes (Signed)
Quick Note:  Inform patient of Pathology report,. Tell her that breast lumpectomy completely benign .Marland KitchenMarland KitchenGood news. Will discuss in detail at [place] ______

## 2015-07-19 DIAGNOSIS — L089 Local infection of the skin and subcutaneous tissue, unspecified: Secondary | ICD-10-CM | POA: Diagnosis not present

## 2015-07-19 DIAGNOSIS — M79675 Pain in left toe(s): Secondary | ICD-10-CM | POA: Diagnosis not present

## 2015-07-19 DIAGNOSIS — M2042 Other hammer toe(s) (acquired), left foot: Secondary | ICD-10-CM | POA: Diagnosis not present

## 2015-07-19 NOTE — Telephone Encounter (Signed)
Patient did not pick up this Rx.

## 2015-08-15 ENCOUNTER — Ambulatory Visit: Payer: PPO | Admitting: Adult Health

## 2015-08-16 DIAGNOSIS — M2042 Other hammer toe(s) (acquired), left foot: Secondary | ICD-10-CM | POA: Diagnosis not present

## 2015-09-06 DIAGNOSIS — N390 Urinary tract infection, site not specified: Secondary | ICD-10-CM | POA: Diagnosis not present

## 2015-09-06 DIAGNOSIS — Z6825 Body mass index (BMI) 25.0-25.9, adult: Secondary | ICD-10-CM | POA: Diagnosis not present

## 2015-09-06 DIAGNOSIS — Z23 Encounter for immunization: Secondary | ICD-10-CM | POA: Diagnosis not present

## 2015-10-30 DIAGNOSIS — J322 Chronic ethmoidal sinusitis: Secondary | ICD-10-CM | POA: Diagnosis not present

## 2015-10-30 DIAGNOSIS — H7102 Cholesteatoma of attic, left ear: Secondary | ICD-10-CM | POA: Diagnosis not present

## 2015-10-30 DIAGNOSIS — H6523 Chronic serous otitis media, bilateral: Secondary | ICD-10-CM | POA: Diagnosis not present

## 2015-10-30 DIAGNOSIS — H8142 Vertigo of central origin, left ear: Secondary | ICD-10-CM | POA: Diagnosis not present

## 2015-10-30 DIAGNOSIS — H9312 Tinnitus, left ear: Secondary | ICD-10-CM | POA: Diagnosis not present

## 2015-10-30 DIAGNOSIS — H903 Sensorineural hearing loss, bilateral: Secondary | ICD-10-CM | POA: Diagnosis not present

## 2015-10-30 DIAGNOSIS — J320 Chronic maxillary sinusitis: Secondary | ICD-10-CM | POA: Diagnosis not present

## 2015-11-12 DIAGNOSIS — D333 Benign neoplasm of cranial nerves: Secondary | ICD-10-CM | POA: Diagnosis not present

## 2015-11-13 DIAGNOSIS — D333 Benign neoplasm of cranial nerves: Secondary | ICD-10-CM | POA: Diagnosis not present

## 2015-11-13 DIAGNOSIS — H9042 Sensorineural hearing loss, unilateral, left ear, with unrestricted hearing on the contralateral side: Secondary | ICD-10-CM | POA: Diagnosis not present

## 2015-12-17 DIAGNOSIS — Z0110 Encounter for examination of ears and hearing without abnormal findings: Secondary | ICD-10-CM | POA: Diagnosis not present

## 2015-12-17 DIAGNOSIS — Z881 Allergy status to other antibiotic agents status: Secondary | ICD-10-CM | POA: Diagnosis not present

## 2015-12-17 DIAGNOSIS — H903 Sensorineural hearing loss, bilateral: Secondary | ICD-10-CM | POA: Diagnosis not present

## 2015-12-17 DIAGNOSIS — D333 Benign neoplasm of cranial nerves: Secondary | ICD-10-CM | POA: Diagnosis not present

## 2015-12-17 DIAGNOSIS — Z882 Allergy status to sulfonamides status: Secondary | ICD-10-CM | POA: Diagnosis not present

## 2015-12-17 DIAGNOSIS — H9042 Sensorineural hearing loss, unilateral, left ear, with unrestricted hearing on the contralateral side: Secondary | ICD-10-CM | POA: Diagnosis not present

## 2015-12-17 DIAGNOSIS — I10 Essential (primary) hypertension: Secondary | ICD-10-CM | POA: Diagnosis not present

## 2015-12-30 ENCOUNTER — Other Ambulatory Visit: Payer: Self-pay | Admitting: General Surgery

## 2015-12-30 DIAGNOSIS — Z1231 Encounter for screening mammogram for malignant neoplasm of breast: Secondary | ICD-10-CM

## 2016-01-27 ENCOUNTER — Ambulatory Visit
Admission: RE | Admit: 2016-01-27 | Discharge: 2016-01-27 | Disposition: A | Discharge status: Home / Self Care | Payer: PPO | Source: Ambulatory Visit | Attending: General Surgery | Admitting: General Surgery

## 2016-01-27 DIAGNOSIS — Z1231 Encounter for screening mammogram for malignant neoplasm of breast: Secondary | ICD-10-CM

## 2016-02-19 ENCOUNTER — Encounter: Payer: Self-pay | Admitting: Neurology

## 2016-02-19 ENCOUNTER — Ambulatory Visit: Payer: PPO | Admitting: Neurology

## 2016-02-19 VITALS — BP 141/72 | HR 71 | Ht 67.0 in | Wt 148.0 lb

## 2016-02-19 DIAGNOSIS — R269 Unspecified abnormalities of gait and mobility: Secondary | ICD-10-CM

## 2016-02-19 DIAGNOSIS — G609 Hereditary and idiopathic neuropathy, unspecified: Secondary | ICD-10-CM

## 2016-02-19 HISTORY — DX: Unspecified abnormalities of gait and mobility: R26.9

## 2016-02-19 NOTE — Patient Instructions (Addendum)
Fall Prevention in the Home  Falls can cause injuries and can affect people from all age groups. There are many simple things that you can do to make your home safe and to help prevent falls. WHAT CAN I DO ON THE OUTSIDE OF MY HOME?  Regularly repair the edges of walkways and driveways and fix any cracks.  Remove high doorway thresholds.  Trim any shrubbery on the main path into your home.  Use bright outdoor lighting.  Clear walkways of debris and clutter, including tools and rocks.  Regularly check that handrails are securely fastened and in good repair. Both sides of any steps should have handrails.  Install guardrails along the edges of any raised decks or porches.  Have leaves, snow, and ice cleared regularly.  Use sand or salt on walkways during winter months.  In the garage, clean up any spills right away, including grease or oil spills. WHAT CAN I DO IN THE BATHROOM?  Use night lights.  Install grab bars by the toilet and in the tub and shower. Do not use towel bars as grab bars.  Use non-skid mats or decals on the floor of the tub or shower.  If you need to sit down while you are in the shower, use a plastic, non-slip stool..  Keep the floor dry. Immediately clean up any water that spills on the floor.  Remove soap buildup in the tub or shower on a regular basis.  Attach bath mats securely with double-sided non-slip rug tape.  Remove throw rugs and other tripping hazards from the floor. WHAT CAN I DO IN THE BEDROOM?  Use night lights.  Make sure that a bedside light is easy to reach.  Do not use oversized bedding that drapes onto the floor.  Have a firm chair that has side arms to use for getting dressed.  Remove throw rugs and other tripping hazards from the floor. WHAT CAN I DO IN THE KITCHEN?   Clean up any spills right away.  Avoid walking on wet floors.  Place frequently used items in easy-to-reach places.  If you need to reach for something  above you, use a sturdy step stool that has a grab bar.  Keep electrical cables out of the way.  Do not use floor polish or wax that makes floors slippery. If you have to use wax, make sure that it is non-skid floor wax.  Remove throw rugs and other tripping hazards from the floor. WHAT CAN I DO IN THE STAIRWAYS?  Do not leave any items on the stairs.  Make sure that there are handrails on both sides of the stairs. Fix handrails that are broken or loose. Make sure that handrails are as long as the stairways.  Check any carpeting to make sure that it is firmly attached to the stairs. Fix any carpet that is loose or worn.  Avoid having throw rugs at the top or bottom of stairways, or secure the rugs with carpet tape to prevent them from moving.  Make sure that you have a light switch at the top of the stairs and the bottom of the stairs. If you do not have them, have them installed. WHAT ARE SOME OTHER FALL PREVENTION TIPS?  Wear closed-toe shoes that fit well and support your feet. Wear shoes that have rubber soles or low heels.  When you use a stepladder, make sure that it is completely opened and that the sides are firmly locked. Have someone hold the ladder while you   are using it. Do not climb a closed stepladder.  Add color or contrast paint or tape to grab bars and handrails in your home. Place contrasting color strips on the first and last steps.  Use mobility aids as needed, such as canes, walkers, scooters, and crutches.  Turn on lights if it is dark. Replace any light bulbs that burn out.  Set up furniture so that there are clear paths. Keep the furniture in the same spot.  Fix any uneven floor surfaces.  Choose a carpet design that does not hide the edge of steps of a stairway.  Be aware of any and all pets.  Review your medicines with your healthcare provider. Some medicines can cause dizziness or changes in blood pressure, which increase your risk of falling. Talk  with your health care provider about other ways that you can decrease your risk of falls. This may include working with a physical therapist or trainer to improve your strength, balance, and endurance.   This information is not intended to replace advice given to you by your health care provider. Make sure you discuss any questions you have with your health care provider.   Document Released: 06/19/2002 Document Revised: 11/13/2014 Document Reviewed: 08/03/2014 Elsevier Interactive Patient Education 2016 Elsevier Inc.  

## 2016-02-19 NOTE — Progress Notes (Signed)
Reason for visit: Peripheral neuropathy  [name] is an 75 y.o. female  History of present illness:  Ms. [name] is a 75 year old right-handed white female with a history of peripheral neuropathy. The patient is on Lyrica, she has done fairly well with this medication in terms of pain control. Occasionally, she may have lancinating pains in the side of the foot. The patient has also had some difficulty with irritability, she has also had some problems with insomnia. The patient is taking Benadryl and clonazepam at night and this allows her to sleep. The patient has had relatively good balance, she has not had any falls, she does not use a cane for ambulation. 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 The patient is followed by Dr. Ernesto Rutherford for this. The patient returns to this office for an evaluation. There have been no major changes over the last year with her neuropathy issue. The patient reports that she does not have much of a sensation of urination, she will have to go to the bathroom on a regular schedule as she does not feel that her bladder is full. She has not had any incontinence issues, when she does void her bladder she is able to do this well.  Past Medical History:  Diagnosis Date  . Abnormality of gait 02/19/2016  . Anxiety   . Benign positional vertigo   . Broken foot    left leg and toe  . Bronchitis   . Charcot Marie Tooth muscular atrophy   . Hemoglobin low    and hemocrit  . Hyperlipidemia   . Hypertension   . Iron deficiency anemia   . Osteoporosis   . Peripheral neuropathy ([place])   . Pneumonia   . Prepyloric ulcer   . RLQ abdominal pain 11/24/2013   Hx of ovarian remnant ,now has pain down right leg  Will get [place]  . Shingles    Right V1 distribution    Past Surgical History:  Procedure Laterality Date  . ABDOMINAL HYSTERECTOMY  1979  . BILATERAL  OOPHORECTOMY     1996  . BREAST LUMPECTOMY WITH RADIOACTIVE SEED LOCALIZATION Left 04/02/2015   Procedure: BREAST LUMPECTOMY WITH RADIOACTIVE SEED LOCALIZATION;  Surgeon: Fanny Skates, MD;  Location: [facility];  Service: General;  Laterality: Left;  . CATARACT EXTRACTION Bilateral   . CHOLECYSTECTOMY    . COLONOSCOPY     cameria capsule  . COLONOSCOPY N/A 06/22/2013   Procedure: COLONOSCOPY;  Surgeon: Daneil Dolin, MD;  [facility];  Service: Endoscopy;  Laterality: N/A;  1:00 PM  . FOOT SURGERY  06/11/06   rt.    Marland Kitchen MASTOIDECTOMY    . retroperitoneal cyst     drainage of  . TOTAL ABDOMINAL HYSTERECTOMY W/ BILATERAL SALPINGOOPHORECTOMY  1996    Family History  Problem Relation Age of Onset  . Adopted: Yes    Social history:  reports that she has never smoked. She has never used smokeless tobacco. She reports that she does not drink alcohol or use drugs.    Allergies  Allergen Reactions  . Ciprofloxacin Hives and Rash  . Sulfonamide Derivatives Hives and Rash    Medications:  Prior to Admission medications   Medication Sig Start Date End Date Taking? Authorizing Provider  acetaminophen (TYLENOL) 500 MG tablet Take by mouth every 6 (six) hours as needed for  pain.   Yes Historical Provider, MD  amLODipine-valsartan (EXFORGE) 5-160 MG per tablet Take 1 tablet by mouth daily.   Yes Historical Provider, MD  clonazePAM (KLONOPIN) 0.5 MG tablet Take 0.5 mg by mouth daily as needed for anxiety.    Yes Historical Provider, MD  iron polysaccharides (NIFEREX) 150 MG capsule Take 150 mg by mouth 3 (three) times a week. Monday, Wednesday, and friday   Yes Historical Provider, MD  Multiple Vitamins-Minerals (CENTRUM SILVER PO) Take by mouth daily.     Yes Historical Provider, MD  pregabalin (LYRICA) 150 MG capsule Take 1 capsule (150 mg total) by mouth 3 (three) times daily. 08/30/14  Yes Kathrynn Ducking, MD  risedronate (ACTONEL) 150 MG tablet Take 150 mg by  mouth every 30 (thirty) days. with water on empty stomach, nothing by mouth or lie down for next 30 minutes.   Yes Historical Provider, MD    ROS:  Out of a complete 14 system review of symptoms, the patient complains only of the following symptoms, and all other reviewed systems are negative.  Leg swelling Insomnia Decreased urine Agitation  Blood pressure (!) 141/72, pulse 71, height 5\' 7"  (1.702 m), weight 148 lb (67.1 kg).  Physical Exam  General: The patient is alert and cooperative at the time of the examination.  Skin: No significant peripheral edema is noted.   Neurologic Exam  Mental status: The patient is alert and oriented x 3 at the time of the examination. The patient has apparent normal recent and remote memory, with an apparently normal attention span and concentration ability.   Cranial nerves: Facial symmetry is present. Speech is normal, no aphasia or dysarthria is noted. Extraocular movements are full. Visual fields are full.  Motor: The patient has good strength in all 4 extremities.  Sensory examination: Soft touch sensation is symmetric on the face, arms, and legs. There is a stocking pattern pinprick sensory deficit up to the knees bilaterally.   Coordination: The patient has good finger-nose-finger and heel-to-shin bilaterally.  Gait and station: The patient has a slightly wide-based gait. The patient walks independently, tandem gait is slightly unsteady. Romberg is negative. No drift is seen.  Reflexes: Deep tendon reflexes are symmetric, but are decreased.   Assessment/Plan:  1. Peripheral neuropathy  2. Mild gait disturbance  3. Chronic insomnia  4. Irritability  The patient does not wish to go on any medications for her irritability. We may consider adding Cymbalta in the future if this worsens. The patient has had some mild ankle swelling on the Lyrica, otherwise she seems to tolerate the medication well. The patient indicates that her  primary care physician is writing the prescription for the Lyrica. She will follow-up in one year, sooner if needed.  Jill Alexanders MD 02/19/2016 8:46 AM  [facility] [address] [place], [place] [address]  Phone [phone] Fax [phone]

## 2016-03-17 DIAGNOSIS — F419 Anxiety disorder, unspecified: Secondary | ICD-10-CM | POA: Diagnosis not present

## 2016-03-17 DIAGNOSIS — G629 Polyneuropathy, unspecified: Secondary | ICD-10-CM | POA: Diagnosis not present

## 2016-03-17 DIAGNOSIS — G600 Hereditary motor and sensory neuropathy: Secondary | ICD-10-CM | POA: Diagnosis not present

## 2016-03-17 DIAGNOSIS — R928 Other abnormal and inconclusive findings on diagnostic imaging of breast: Secondary | ICD-10-CM | POA: Diagnosis not present

## 2016-03-17 DIAGNOSIS — I10 Essential (primary) hypertension: Secondary | ICD-10-CM | POA: Diagnosis not present

## 2016-04-02 DIAGNOSIS — M7552 Bursitis of left shoulder: Secondary | ICD-10-CM | POA: Diagnosis not present

## 2016-05-25 DIAGNOSIS — Z23 Encounter for immunization: Secondary | ICD-10-CM | POA: Diagnosis not present

## 2016-08-10 DIAGNOSIS — I10 Essential (primary) hypertension: Secondary | ICD-10-CM | POA: Diagnosis not present

## 2016-08-10 DIAGNOSIS — G609 Hereditary and idiopathic neuropathy, unspecified: Secondary | ICD-10-CM | POA: Diagnosis not present

## 2016-08-10 DIAGNOSIS — Z79899 Other long term (current) drug therapy: Secondary | ICD-10-CM | POA: Diagnosis not present

## 2016-08-17 DIAGNOSIS — M810 Age-related osteoporosis without current pathological fracture: Secondary | ICD-10-CM | POA: Diagnosis not present

## 2016-08-17 DIAGNOSIS — G4700 Insomnia, unspecified: Secondary | ICD-10-CM | POA: Diagnosis not present

## 2016-08-17 DIAGNOSIS — I10 Essential (primary) hypertension: Secondary | ICD-10-CM | POA: Diagnosis not present

## 2016-08-17 DIAGNOSIS — Z6824 Body mass index (BMI) 24.0-24.9, adult: Secondary | ICD-10-CM | POA: Diagnosis not present

## 2016-08-18 ENCOUNTER — Other Ambulatory Visit (HOSPITAL_COMMUNITY): Payer: Self-pay | Admitting: Internal Medicine

## 2016-08-18 DIAGNOSIS — Z780 Asymptomatic menopausal state: Secondary | ICD-10-CM

## 2016-08-19 DIAGNOSIS — L739 Follicular disorder, unspecified: Secondary | ICD-10-CM | POA: Diagnosis not present

## 2016-08-19 DIAGNOSIS — L309 Dermatitis, unspecified: Secondary | ICD-10-CM | POA: Diagnosis not present

## 2016-08-25 ENCOUNTER — Ambulatory Visit (HOSPITAL_COMMUNITY)
Admission: RE | Admit: 2016-08-25 | Discharge: 2016-08-25 | Disposition: A | Discharge status: Home / Self Care | Payer: PPO | Source: Ambulatory Visit | Attending: Internal Medicine | Admitting: Internal Medicine

## 2016-08-25 DIAGNOSIS — Z780 Asymptomatic menopausal state: Secondary | ICD-10-CM | POA: Diagnosis not present

## 2016-08-25 DIAGNOSIS — M810 Age-related osteoporosis without current pathological fracture: Secondary | ICD-10-CM | POA: Insufficient documentation

## 2016-12-22 DIAGNOSIS — Z882 Allergy status to sulfonamides status: Secondary | ICD-10-CM | POA: Diagnosis not present

## 2016-12-22 DIAGNOSIS — H903 Sensorineural hearing loss, bilateral: Secondary | ICD-10-CM | POA: Diagnosis not present

## 2016-12-22 DIAGNOSIS — D333 Benign neoplasm of cranial nerves: Secondary | ICD-10-CM | POA: Diagnosis not present

## 2016-12-22 DIAGNOSIS — I10 Essential (primary) hypertension: Secondary | ICD-10-CM | POA: Diagnosis not present

## 2016-12-22 DIAGNOSIS — Z881 Allergy status to other antibiotic agents status: Secondary | ICD-10-CM | POA: Diagnosis not present

## 2016-12-22 DIAGNOSIS — Z79899 Other long term (current) drug therapy: Secondary | ICD-10-CM | POA: Diagnosis not present

## 2016-12-22 DIAGNOSIS — H905 Unspecified sensorineural hearing loss: Secondary | ICD-10-CM | POA: Diagnosis not present

## 2016-12-22 DIAGNOSIS — H9042 Sensorineural hearing loss, unilateral, left ear, with unrestricted hearing on the contralateral side: Secondary | ICD-10-CM | POA: Diagnosis not present

## 2016-12-30 ENCOUNTER — Other Ambulatory Visit: Payer: Self-pay | Admitting: General Surgery

## 2016-12-30 DIAGNOSIS — Z1231 Encounter for screening mammogram for malignant neoplasm of breast: Secondary | ICD-10-CM

## 2017-01-27 ENCOUNTER — Ambulatory Visit: Payer: PPO

## 2017-02-01 ENCOUNTER — Ambulatory Visit
Admission: RE | Admit: 2017-02-01 | Discharge: 2017-02-01 | Disposition: A | Discharge status: Home / Self Care | Payer: PPO | Source: Ambulatory Visit | Attending: General Surgery | Admitting: General Surgery

## 2017-02-01 DIAGNOSIS — Z1231 Encounter for screening mammogram for malignant neoplasm of breast: Secondary | ICD-10-CM

## 2017-02-15 DIAGNOSIS — G4700 Insomnia, unspecified: Secondary | ICD-10-CM | POA: Diagnosis not present

## 2017-02-15 DIAGNOSIS — I10 Essential (primary) hypertension: Secondary | ICD-10-CM | POA: Diagnosis not present

## 2017-02-15 DIAGNOSIS — M810 Age-related osteoporosis without current pathological fracture: Secondary | ICD-10-CM | POA: Diagnosis not present

## 2017-02-15 DIAGNOSIS — G9009 Other idiopathic peripheral autonomic neuropathy: Secondary | ICD-10-CM | POA: Diagnosis not present

## 2017-02-22 ENCOUNTER — Ambulatory Visit: Payer: PPO | Admitting: Adult Health

## 2017-06-01 DIAGNOSIS — Z23 Encounter for immunization: Secondary | ICD-10-CM | POA: Diagnosis not present

## 2017-07-27 DIAGNOSIS — K149 Disease of tongue, unspecified: Secondary | ICD-10-CM | POA: Diagnosis not present

## 2017-07-27 DIAGNOSIS — L929 Granulomatous disorder of the skin and subcutaneous tissue, unspecified: Secondary | ICD-10-CM | POA: Diagnosis not present

## 2017-07-27 DIAGNOSIS — H90A12 Conductive hearing loss, unilateral, left ear with restricted hearing on the contralateral side: Secondary | ICD-10-CM | POA: Diagnosis not present

## 2017-07-27 DIAGNOSIS — H938X2 Other specified disorders of left ear: Secondary | ICD-10-CM | POA: Diagnosis not present

## 2017-07-27 DIAGNOSIS — H9042 Sensorineural hearing loss, unilateral, left ear, with unrestricted hearing on the contralateral side: Secondary | ICD-10-CM | POA: Diagnosis not present

## 2017-07-27 DIAGNOSIS — D333 Benign neoplasm of cranial nerves: Secondary | ICD-10-CM | POA: Diagnosis not present

## 2017-07-27 DIAGNOSIS — H61892 Other specified disorders of left external ear: Secondary | ICD-10-CM | POA: Diagnosis not present

## 2017-07-27 DIAGNOSIS — H6092 Unspecified otitis externa, left ear: Secondary | ICD-10-CM | POA: Diagnosis not present

## 2017-08-10 DIAGNOSIS — Z881 Allergy status to other antibiotic agents status: Secondary | ICD-10-CM | POA: Diagnosis not present

## 2017-08-10 DIAGNOSIS — H61892 Other specified disorders of left external ear: Secondary | ICD-10-CM | POA: Diagnosis not present

## 2017-08-10 DIAGNOSIS — Z882 Allergy status to sulfonamides status: Secondary | ICD-10-CM | POA: Diagnosis not present

## 2017-08-10 DIAGNOSIS — H918X2 Other specified hearing loss, left ear: Secondary | ICD-10-CM | POA: Diagnosis not present

## 2017-08-10 DIAGNOSIS — D333 Benign neoplasm of cranial nerves: Secondary | ICD-10-CM | POA: Diagnosis not present

## 2017-08-10 DIAGNOSIS — H903 Sensorineural hearing loss, bilateral: Secondary | ICD-10-CM | POA: Diagnosis not present

## 2017-08-13 DIAGNOSIS — H903 Sensorineural hearing loss, bilateral: Secondary | ICD-10-CM | POA: Diagnosis not present

## 2017-08-13 DIAGNOSIS — M2669 Other specified disorders of temporomandibular joint: Secondary | ICD-10-CM | POA: Diagnosis not present

## 2017-08-13 DIAGNOSIS — Z9889 Other specified postprocedural states: Secondary | ICD-10-CM | POA: Diagnosis not present

## 2017-08-17 DIAGNOSIS — Z86018 Personal history of other benign neoplasm: Secondary | ICD-10-CM | POA: Diagnosis not present

## 2017-08-17 DIAGNOSIS — Z881 Allergy status to other antibiotic agents status: Secondary | ICD-10-CM | POA: Diagnosis not present

## 2017-08-17 DIAGNOSIS — H9042 Sensorineural hearing loss, unilateral, left ear, with unrestricted hearing on the contralateral side: Secondary | ICD-10-CM | POA: Diagnosis not present

## 2017-08-17 DIAGNOSIS — Z882 Allergy status to sulfonamides status: Secondary | ICD-10-CM | POA: Diagnosis not present

## 2017-08-17 DIAGNOSIS — H918X2 Other specified hearing loss, left ear: Secondary | ICD-10-CM | POA: Diagnosis not present

## 2017-08-17 DIAGNOSIS — H7442 Polyp of left middle ear: Secondary | ICD-10-CM | POA: Diagnosis not present

## 2017-08-18 DIAGNOSIS — I10 Essential (primary) hypertension: Secondary | ICD-10-CM | POA: Diagnosis not present

## 2017-08-18 DIAGNOSIS — G4700 Insomnia, unspecified: Secondary | ICD-10-CM | POA: Diagnosis not present

## 2017-08-18 DIAGNOSIS — M810 Age-related osteoporosis without current pathological fracture: Secondary | ICD-10-CM | POA: Diagnosis not present

## 2017-08-18 DIAGNOSIS — Z79899 Other long term (current) drug therapy: Secondary | ICD-10-CM | POA: Diagnosis not present

## 2017-08-24 DIAGNOSIS — Z23 Encounter for immunization: Secondary | ICD-10-CM | POA: Diagnosis not present

## 2017-08-24 DIAGNOSIS — Z6825 Body mass index (BMI) 25.0-25.9, adult: Secondary | ICD-10-CM | POA: Diagnosis not present

## 2017-08-24 DIAGNOSIS — M1990 Unspecified osteoarthritis, unspecified site: Secondary | ICD-10-CM | POA: Diagnosis not present

## 2017-08-24 DIAGNOSIS — I10 Essential (primary) hypertension: Secondary | ICD-10-CM | POA: Diagnosis not present

## 2017-08-24 DIAGNOSIS — F419 Anxiety disorder, unspecified: Secondary | ICD-10-CM | POA: Diagnosis not present

## 2017-09-24 DIAGNOSIS — H26493 Other secondary cataract, bilateral: Secondary | ICD-10-CM | POA: Diagnosis not present

## 2017-10-20 DIAGNOSIS — H26492 Other secondary cataract, left eye: Secondary | ICD-10-CM | POA: Diagnosis not present

## 2017-10-27 DIAGNOSIS — H26491 Other secondary cataract, right eye: Secondary | ICD-10-CM | POA: Diagnosis not present

## 2017-12-30 ENCOUNTER — Other Ambulatory Visit: Payer: Self-pay | Admitting: General Surgery

## 2017-12-30 DIAGNOSIS — Z1231 Encounter for screening mammogram for malignant neoplasm of breast: Secondary | ICD-10-CM

## 2018-01-20 DIAGNOSIS — H04123 Dry eye syndrome of bilateral lacrimal glands: Secondary | ICD-10-CM | POA: Diagnosis not present

## 2018-02-07 ENCOUNTER — Ambulatory Visit: Payer: PPO

## 2018-02-21 ENCOUNTER — Ambulatory Visit
Admission: RE | Admit: 2018-02-21 | Discharge: 2018-02-21 | Disposition: A | Discharge status: Home / Self Care | Payer: PPO | Source: Ambulatory Visit | Attending: General Surgery | Admitting: General Surgery

## 2018-02-21 DIAGNOSIS — Z1231 Encounter for screening mammogram for malignant neoplasm of breast: Secondary | ICD-10-CM | POA: Diagnosis not present

## 2018-03-01 ENCOUNTER — Other Ambulatory Visit (HOSPITAL_COMMUNITY): Payer: Self-pay | Admitting: Internal Medicine

## 2018-03-01 ENCOUNTER — Ambulatory Visit (HOSPITAL_COMMUNITY)
Admission: RE | Admit: 2018-03-01 | Discharge: 2018-03-01 | Disposition: A | Discharge status: Home / Self Care | Payer: PPO | Source: Ambulatory Visit | Attending: Internal Medicine | Admitting: Internal Medicine

## 2018-03-01 DIAGNOSIS — M545 Low back pain: Secondary | ICD-10-CM

## 2018-03-01 DIAGNOSIS — I10 Essential (primary) hypertension: Secondary | ICD-10-CM | POA: Diagnosis not present

## 2018-03-01 DIAGNOSIS — M4316 Spondylolisthesis, lumbar region: Secondary | ICD-10-CM | POA: Insufficient documentation

## 2018-03-01 DIAGNOSIS — M79604 Pain in right leg: Secondary | ICD-10-CM

## 2018-03-01 DIAGNOSIS — G9009 Other idiopathic peripheral autonomic neuropathy: Secondary | ICD-10-CM | POA: Diagnosis not present

## 2018-03-07 ENCOUNTER — Other Ambulatory Visit (HOSPITAL_COMMUNITY): Payer: Self-pay | Admitting: Internal Medicine

## 2018-03-07 DIAGNOSIS — M4316 Spondylolisthesis, lumbar region: Secondary | ICD-10-CM

## 2018-03-10 ENCOUNTER — Ambulatory Visit (HOSPITAL_COMMUNITY)
Admission: RE | Admit: 2018-03-10 | Discharge: 2018-03-10 | Disposition: A | Discharge status: Home / Self Care | Payer: PPO | Source: Ambulatory Visit | Attending: Internal Medicine | Admitting: Internal Medicine

## 2018-03-10 DIAGNOSIS — M7138 Other bursal cyst, other site: Secondary | ICD-10-CM | POA: Insufficient documentation

## 2018-03-10 DIAGNOSIS — M4316 Spondylolisthesis, lumbar region: Secondary | ICD-10-CM | POA: Diagnosis not present

## 2018-03-10 DIAGNOSIS — M48061 Spinal stenosis, lumbar region without neurogenic claudication: Secondary | ICD-10-CM | POA: Diagnosis not present

## 2018-03-10 DIAGNOSIS — M4726 Other spondylosis with radiculopathy, lumbar region: Secondary | ICD-10-CM | POA: Diagnosis not present

## 2018-03-10 DIAGNOSIS — M5416 Radiculopathy, lumbar region: Secondary | ICD-10-CM | POA: Diagnosis present

## 2018-03-10 DIAGNOSIS — M545 Low back pain: Secondary | ICD-10-CM | POA: Diagnosis not present

## 2018-03-10 DIAGNOSIS — M8938 Hypertrophy of bone, other site: Secondary | ICD-10-CM | POA: Insufficient documentation

## 2018-03-11 ENCOUNTER — Ambulatory Visit (HOSPITAL_COMMUNITY): Payer: PPO

## 2018-04-05 DIAGNOSIS — M48061 Spinal stenosis, lumbar region without neurogenic claudication: Secondary | ICD-10-CM | POA: Insufficient documentation

## 2018-04-05 DIAGNOSIS — M48062 Spinal stenosis, lumbar region with neurogenic claudication: Secondary | ICD-10-CM | POA: Diagnosis not present

## 2018-04-05 DIAGNOSIS — M47816 Spondylosis without myelopathy or radiculopathy, lumbar region: Secondary | ICD-10-CM | POA: Diagnosis not present

## 2018-04-05 DIAGNOSIS — M4316 Spondylolisthesis, lumbar region: Secondary | ICD-10-CM | POA: Insufficient documentation

## 2018-05-27 DIAGNOSIS — Z23 Encounter for immunization: Secondary | ICD-10-CM | POA: Diagnosis not present

## 2018-08-22 DIAGNOSIS — Z79899 Other long term (current) drug therapy: Secondary | ICD-10-CM | POA: Diagnosis not present

## 2018-08-22 DIAGNOSIS — G609 Hereditary and idiopathic neuropathy, unspecified: Secondary | ICD-10-CM | POA: Diagnosis not present

## 2018-08-22 DIAGNOSIS — M810 Age-related osteoporosis without current pathological fracture: Secondary | ICD-10-CM | POA: Diagnosis not present

## 2018-08-22 DIAGNOSIS — I10 Essential (primary) hypertension: Secondary | ICD-10-CM | POA: Diagnosis not present

## 2018-08-25 DIAGNOSIS — H838X2 Other specified diseases of left inner ear: Secondary | ICD-10-CM | POA: Diagnosis not present

## 2018-08-25 DIAGNOSIS — H9212 Otorrhea, left ear: Secondary | ICD-10-CM | POA: Diagnosis not present

## 2018-08-25 DIAGNOSIS — D333 Benign neoplasm of cranial nerves: Secondary | ICD-10-CM | POA: Diagnosis not present

## 2018-08-25 DIAGNOSIS — H61892 Other specified disorders of left external ear: Secondary | ICD-10-CM | POA: Diagnosis not present

## 2018-08-25 DIAGNOSIS — H903 Sensorineural hearing loss, bilateral: Secondary | ICD-10-CM | POA: Diagnosis not present

## 2018-08-30 ENCOUNTER — Other Ambulatory Visit (HOSPITAL_COMMUNITY): Payer: Self-pay | Admitting: Internal Medicine

## 2018-08-30 DIAGNOSIS — Z780 Asymptomatic menopausal state: Secondary | ICD-10-CM

## 2018-08-30 DIAGNOSIS — G4700 Insomnia, unspecified: Secondary | ICD-10-CM | POA: Diagnosis not present

## 2018-08-30 DIAGNOSIS — Z6826 Body mass index (BMI) 26.0-26.9, adult: Secondary | ICD-10-CM | POA: Diagnosis not present

## 2018-08-30 DIAGNOSIS — I10 Essential (primary) hypertension: Secondary | ICD-10-CM | POA: Diagnosis not present

## 2018-08-30 DIAGNOSIS — M810 Age-related osteoporosis without current pathological fracture: Secondary | ICD-10-CM | POA: Diagnosis not present

## 2018-09-08 ENCOUNTER — Other Ambulatory Visit (HOSPITAL_COMMUNITY): Payer: PPO

## 2018-09-12 ENCOUNTER — Ambulatory Visit (HOSPITAL_COMMUNITY)
Admission: RE | Admit: 2018-09-12 | Discharge: 2018-09-12 | Disposition: A | Discharge status: Home / Self Care | Payer: PPO | Source: Ambulatory Visit | Attending: Internal Medicine | Admitting: Internal Medicine

## 2018-09-12 DIAGNOSIS — Z780 Asymptomatic menopausal state: Secondary | ICD-10-CM

## 2018-09-12 DIAGNOSIS — M810 Age-related osteoporosis without current pathological fracture: Secondary | ICD-10-CM | POA: Diagnosis not present

## 2018-10-03 DIAGNOSIS — H903 Sensorineural hearing loss, bilateral: Secondary | ICD-10-CM | POA: Diagnosis not present

## 2018-12-21 DIAGNOSIS — H401133 Primary open-angle glaucoma, bilateral, severe stage: Secondary | ICD-10-CM | POA: Diagnosis not present

## 2018-12-21 DIAGNOSIS — H524 Presbyopia: Secondary | ICD-10-CM | POA: Diagnosis not present

## 2018-12-21 DIAGNOSIS — H25012 Cortical age-related cataract, left eye: Secondary | ICD-10-CM | POA: Diagnosis not present

## 2018-12-21 DIAGNOSIS — H2512 Age-related nuclear cataract, left eye: Secondary | ICD-10-CM | POA: Diagnosis not present

## 2019-02-13 ENCOUNTER — Other Ambulatory Visit: Payer: Self-pay | Admitting: General Surgery

## 2019-02-13 DIAGNOSIS — Z1231 Encounter for screening mammogram for malignant neoplasm of breast: Secondary | ICD-10-CM

## 2019-02-28 DIAGNOSIS — M810 Age-related osteoporosis without current pathological fracture: Secondary | ICD-10-CM | POA: Diagnosis not present

## 2019-02-28 DIAGNOSIS — I10 Essential (primary) hypertension: Secondary | ICD-10-CM | POA: Diagnosis not present

## 2019-03-28 ENCOUNTER — Other Ambulatory Visit: Payer: Self-pay

## 2019-03-28 ENCOUNTER — Ambulatory Visit
Admission: RE | Admit: 2019-03-28 | Discharge: 2019-03-28 | Disposition: A | Discharge status: Home / Self Care | Payer: PPO | Source: Ambulatory Visit | Attending: General Surgery | Admitting: General Surgery

## 2019-03-28 DIAGNOSIS — Z1231 Encounter for screening mammogram for malignant neoplasm of breast: Secondary | ICD-10-CM

## 2019-03-29 ENCOUNTER — Other Ambulatory Visit: Payer: Self-pay | Admitting: General Surgery

## 2019-03-29 DIAGNOSIS — R928 Other abnormal and inconclusive findings on diagnostic imaging of breast: Secondary | ICD-10-CM

## 2019-04-05 ENCOUNTER — Other Ambulatory Visit: Payer: Self-pay | Admitting: General Surgery

## 2019-04-05 ENCOUNTER — Other Ambulatory Visit: Payer: Self-pay

## 2019-04-05 ENCOUNTER — Ambulatory Visit
Admission: RE | Admit: 2019-04-05 | Discharge: 2019-04-05 | Disposition: A | Discharge status: Home / Self Care | Payer: PPO | Source: Ambulatory Visit | Attending: General Surgery | Admitting: General Surgery

## 2019-04-05 DIAGNOSIS — R921 Mammographic calcification found on diagnostic imaging of breast: Secondary | ICD-10-CM | POA: Diagnosis not present

## 2019-04-05 DIAGNOSIS — R928 Other abnormal and inconclusive findings on diagnostic imaging of breast: Secondary | ICD-10-CM

## 2019-04-11 ENCOUNTER — Other Ambulatory Visit: Payer: Self-pay

## 2019-04-11 ENCOUNTER — Other Ambulatory Visit: Payer: Self-pay | Admitting: Diagnostic Radiology

## 2019-04-11 ENCOUNTER — Ambulatory Visit
Admission: RE | Admit: 2019-04-11 | Discharge: 2019-04-11 | Disposition: A | Discharge status: Home / Self Care | Payer: PPO | Source: Ambulatory Visit | Attending: General Surgery | Admitting: General Surgery

## 2019-04-11 DIAGNOSIS — R921 Mammographic calcification found on diagnostic imaging of breast: Secondary | ICD-10-CM

## 2019-04-11 DIAGNOSIS — R920 Mammographic microcalcification found on diagnostic imaging of breast: Secondary | ICD-10-CM | POA: Diagnosis not present

## 2019-04-11 DIAGNOSIS — N6012 Diffuse cystic mastopathy of left breast: Secondary | ICD-10-CM | POA: Diagnosis not present

## 2019-04-24 DIAGNOSIS — Z23 Encounter for immunization: Secondary | ICD-10-CM | POA: Diagnosis not present

## 2019-07-11 ENCOUNTER — Other Ambulatory Visit: Payer: Self-pay

## 2019-07-11 ENCOUNTER — Ambulatory Visit: Payer: PPO | Attending: Internal Medicine

## 2019-07-11 DIAGNOSIS — Z20822 Contact with and (suspected) exposure to covid-19: Secondary | ICD-10-CM

## 2019-07-11 DIAGNOSIS — Z20828 Contact with and (suspected) exposure to other viral communicable diseases: Secondary | ICD-10-CM | POA: Diagnosis not present

## 2019-07-12 LAB — NOVEL CORONAVIRUS, NAA: SARS-CoV-2, NAA: NOT DETECTED

## 2019-07-31 ENCOUNTER — Ambulatory Visit: Payer: PPO

## 2019-08-01 ENCOUNTER — Ambulatory Visit: Payer: PPO | Attending: Internal Medicine

## 2019-08-01 DIAGNOSIS — Z23 Encounter for immunization: Secondary | ICD-10-CM | POA: Insufficient documentation

## 2019-08-01 NOTE — Progress Notes (Signed)
   Covid-19 Vaccination Clinic  Name:  ITHZEL ENGLEDOW    MRN: [id number] DOB: June 13, 1941  08/01/2019  Ms. Kenkel was observed post Covid-19 immunization for 15 minutes without incidence. She was provided with Vaccine Information Sheet and instruction to access the V-Safe system.   Ms. Coyt was instructed to call 911 with any severe reactions post vaccine: Marland Kitchen Difficulty breathing  . Swelling of your face and throat  . A fast heartbeat  . A bad rash all over your body  . Dizziness and weakness    Immunizations Administered    Name Date Dose VIS Date Route   Pfizer COVID-19 Vaccine 08/01/2019  1:42 PM 0.3 mL 06/23/2019 Intramuscular   Manufacturer: [place]   Lot: [id number]   [place]: [id number]

## 2019-08-22 ENCOUNTER — Ambulatory Visit: Payer: PPO | Attending: Internal Medicine

## 2019-08-22 DIAGNOSIS — Z23 Encounter for immunization: Secondary | ICD-10-CM

## 2019-08-22 NOTE — Progress Notes (Signed)
   Covid-19 Vaccination Clinic  Name:  [name]    MRN: [id number] DOB: August 19, 1940  08/22/2019  [name] was observed post Covid-19 immunization for 15 minutes without incidence. She was provided with Vaccine Information Sheet and instruction to access the V-Safe system.   [name] was instructed to call 911 with any severe reactions post vaccine: Marland Kitchen Difficulty breathing  . Swelling of your face and throat  . A fast heartbeat  . A bad rash all over your body  . Dizziness and weakness    Immunizations Administered    Name Date Dose VIS Date Route   Pfizer COVID-19 Vaccine 08/22/2019  2:19 PM 0.3 mL 06/23/2019 Intramuscular   Manufacturer: [place]   Lot: [id number]   [place]: [id number]

## 2019-08-30 DIAGNOSIS — G629 Polyneuropathy, unspecified: Secondary | ICD-10-CM | POA: Diagnosis not present

## 2019-08-30 DIAGNOSIS — M810 Age-related osteoporosis without current pathological fracture: Secondary | ICD-10-CM | POA: Diagnosis not present

## 2019-08-30 DIAGNOSIS — Z79899 Other long term (current) drug therapy: Secondary | ICD-10-CM | POA: Diagnosis not present

## 2019-08-30 DIAGNOSIS — G4700 Insomnia, unspecified: Secondary | ICD-10-CM | POA: Diagnosis not present

## 2019-08-30 DIAGNOSIS — I10 Essential (primary) hypertension: Secondary | ICD-10-CM | POA: Diagnosis not present

## 2019-09-01 DIAGNOSIS — G9009 Other idiopathic peripheral autonomic neuropathy: Secondary | ICD-10-CM | POA: Diagnosis not present

## 2019-09-01 DIAGNOSIS — M810 Age-related osteoporosis without current pathological fracture: Secondary | ICD-10-CM | POA: Diagnosis not present

## 2019-09-01 DIAGNOSIS — I10 Essential (primary) hypertension: Secondary | ICD-10-CM | POA: Diagnosis not present

## 2019-09-11 DIAGNOSIS — H9041 Sensorineural hearing loss, unilateral, right ear, with unrestricted hearing on the contralateral side: Secondary | ICD-10-CM | POA: Diagnosis not present

## 2019-09-11 DIAGNOSIS — H9072 Mixed conductive and sensorineural hearing loss, unilateral, left ear, with unrestricted hearing on the contralateral side: Secondary | ICD-10-CM | POA: Diagnosis not present

## 2019-09-11 DIAGNOSIS — D329 Benign neoplasm of meninges, unspecified: Secondary | ICD-10-CM | POA: Diagnosis not present

## 2019-09-11 DIAGNOSIS — D333 Benign neoplasm of cranial nerves: Secondary | ICD-10-CM | POA: Diagnosis not present

## 2019-10-11 DIAGNOSIS — D320 Benign neoplasm of cerebral meninges: Secondary | ICD-10-CM | POA: Diagnosis not present

## 2019-10-11 DIAGNOSIS — D333 Benign neoplasm of cranial nerves: Secondary | ICD-10-CM | POA: Diagnosis not present

## 2019-10-11 DIAGNOSIS — D329 Benign neoplasm of meninges, unspecified: Secondary | ICD-10-CM | POA: Diagnosis not present

## 2019-10-22 DIAGNOSIS — D329 Benign neoplasm of meninges, unspecified: Secondary | ICD-10-CM | POA: Insufficient documentation

## 2019-10-22 DIAGNOSIS — D333 Benign neoplasm of cranial nerves: Secondary | ICD-10-CM | POA: Insufficient documentation

## 2019-11-03 DIAGNOSIS — G629 Polyneuropathy, unspecified: Secondary | ICD-10-CM | POA: Diagnosis not present

## 2019-11-03 DIAGNOSIS — L03032 Cellulitis of left toe: Secondary | ICD-10-CM | POA: Diagnosis not present

## 2019-11-15 ENCOUNTER — Encounter: Payer: Self-pay | Admitting: Podiatry

## 2019-11-15 ENCOUNTER — Ambulatory Visit (INDEPENDENT_AMBULATORY_CARE_PROVIDER_SITE_OTHER): Payer: PPO

## 2019-11-15 ENCOUNTER — Other Ambulatory Visit: Payer: Self-pay | Admitting: Podiatry

## 2019-11-15 ENCOUNTER — Ambulatory Visit: Payer: PPO | Admitting: Podiatry

## 2019-11-15 ENCOUNTER — Other Ambulatory Visit: Payer: Self-pay

## 2019-11-15 VITALS — BP 139/71 | HR 72 | Temp 98.1°F | Resp 16

## 2019-11-15 DIAGNOSIS — H905 Unspecified sensorineural hearing loss: Secondary | ICD-10-CM | POA: Insufficient documentation

## 2019-11-15 DIAGNOSIS — L97522 Non-pressure chronic ulcer of other part of left foot with fat layer exposed: Secondary | ICD-10-CM

## 2019-11-15 DIAGNOSIS — M79672 Pain in left foot: Secondary | ICD-10-CM

## 2019-11-15 DIAGNOSIS — G600 Hereditary motor and sensory neuropathy: Secondary | ICD-10-CM

## 2019-11-15 DIAGNOSIS — M79671 Pain in right foot: Secondary | ICD-10-CM

## 2019-11-15 DIAGNOSIS — H903 Sensorineural hearing loss, bilateral: Secondary | ICD-10-CM | POA: Insufficient documentation

## 2019-11-15 DIAGNOSIS — G629 Polyneuropathy, unspecified: Secondary | ICD-10-CM

## 2019-11-15 DIAGNOSIS — H906 Mixed conductive and sensorineural hearing loss, bilateral: Secondary | ICD-10-CM | POA: Insufficient documentation

## 2019-11-15 NOTE — Progress Notes (Signed)
Subjective:   Patient ID: [name], female   DOB: 79 y.o.   MRN: [id number]   HPI Patient presents concerned about chronic lesion at the end of the second toe left foot and has been to urgent care got antibiotics and it seems to be doing better but she does have some swelling in her foot she wanted checked.  Is concerned about swelling in her feet.  Patient does not smoke likes to be active.  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       Objective:  Physical Exam Vitals and nursing note reviewed.  Constitutional:      Appearance: She is well-developed.  Pulmonary:     Effort: Pulmonary effort is normal.  Musculoskeletal:        General: Normal range of motion.  Skin:    General: Skin is warm.  Neurological:     Mental Status: She is alert.     Neurovascular status intact muscle strength was found to be adequate range of motion was within normal limits.  Patient does have significant loss of sharp dull vibratory and history of Charcot-Marie-Tooth disease and has significant cavus foot structure noted bilateral.  Patient has elongated second digit left with distal contracture with distal keratotic lesion that is painful and low-grade swelling of the left over right forefoot bilateral with history of surgery x2 on the right foot with history of ulceration and does wear diabetic shoes with cushions due to the Charcot-Marie-Tooth and also the severe cavus foot structure     Assessment:  Patient with distal keratotic lesion second digit left that appears to be localized with no current erythema edema drainage with severe foot structural issues and cavus foot structure with neuropathy     Plan:  H&P x-rays reviewed conditions discussed distal debridement digit to left along with buttress  padding and discussed diabetic shoes with thick insoles which have been effective in the past to try to prevent plantar ulcerations that she has history of.  Patient will be seen back for these to be made by ped orthotist and debridement accomplished today with buttress padding and patient will be seen back as needed  X-rays indicate severe midfoot arthritis bilateral with multiple issues associated with Charcot-Marie-Tooth and cavus foot structure

## 2019-11-20 DIAGNOSIS — M659 Synovitis and tenosynovitis, unspecified: Secondary | ICD-10-CM | POA: Diagnosis not present

## 2019-11-27 ENCOUNTER — Ambulatory Visit: Payer: PPO | Admitting: Orthotics

## 2019-11-27 ENCOUNTER — Other Ambulatory Visit: Payer: Self-pay

## 2019-11-27 DIAGNOSIS — G629 Polyneuropathy, unspecified: Secondary | ICD-10-CM

## 2019-11-27 DIAGNOSIS — G600 Hereditary motor and sensory neuropathy: Secondary | ICD-10-CM

## 2019-11-27 NOTE — Progress Notes (Signed)
Patient scanned for new f/o to address cavus foot nature secondary to hx of CMT; also foot deformity.   She recently bought very good NB shoes from store in [place], so she isn't interested in DBS.   I modified old f/o from [employer] to take pressure off of callus/blister 2nd toe left.   Dawn will seek HTA authorization for [place].

## 2019-12-18 ENCOUNTER — Other Ambulatory Visit: Payer: Self-pay

## 2019-12-18 ENCOUNTER — Ambulatory Visit: Payer: PPO | Admitting: Orthotics

## 2019-12-18 DIAGNOSIS — M79671 Pain in right foot: Secondary | ICD-10-CM

## 2019-12-18 DIAGNOSIS — G629 Polyneuropathy, unspecified: Secondary | ICD-10-CM

## 2019-12-18 DIAGNOSIS — G600 Hereditary motor and sensory neuropathy: Secondary | ICD-10-CM

## 2019-12-18 NOTE — Progress Notes (Signed)
Patient came in today to pick up custom made foot orthotics.  The goals were accomplished and the patient reported no dissatisfaction with said orthotics.  Patient was advised of breakin period and how to report any issues. 

## 2019-12-19 DIAGNOSIS — D333 Benign neoplasm of cranial nerves: Secondary | ICD-10-CM | POA: Diagnosis not present

## 2019-12-19 DIAGNOSIS — D320 Benign neoplasm of cerebral meninges: Secondary | ICD-10-CM | POA: Diagnosis not present

## 2019-12-19 DIAGNOSIS — Z510 Encounter for antineoplastic radiation therapy: Secondary | ICD-10-CM | POA: Diagnosis not present

## 2019-12-19 DIAGNOSIS — D329 Benign neoplasm of meninges, unspecified: Secondary | ICD-10-CM | POA: Diagnosis not present

## 2019-12-19 DIAGNOSIS — G9389 Other specified disorders of brain: Secondary | ICD-10-CM | POA: Diagnosis not present

## 2020-02-17 IMAGING — MG MM BREAST BX W LOC DEV 1ST LESION IMAGE BX SPEC STEREO GUIDE*L*
8 of 12 series · 8 of 24 positions shown · non-contrast
Comparison: Previous exams.
COMPARISON: Previous exams.

Addendum:
CLINICAL DATA: Segmental microcalcifications in the upper inner
left breast spanning 3.8 cm.

EXAM:
LEFT BREAST STEREOTACTIC CORE NEEDLE BIOPSY

[L (1 of 6)]
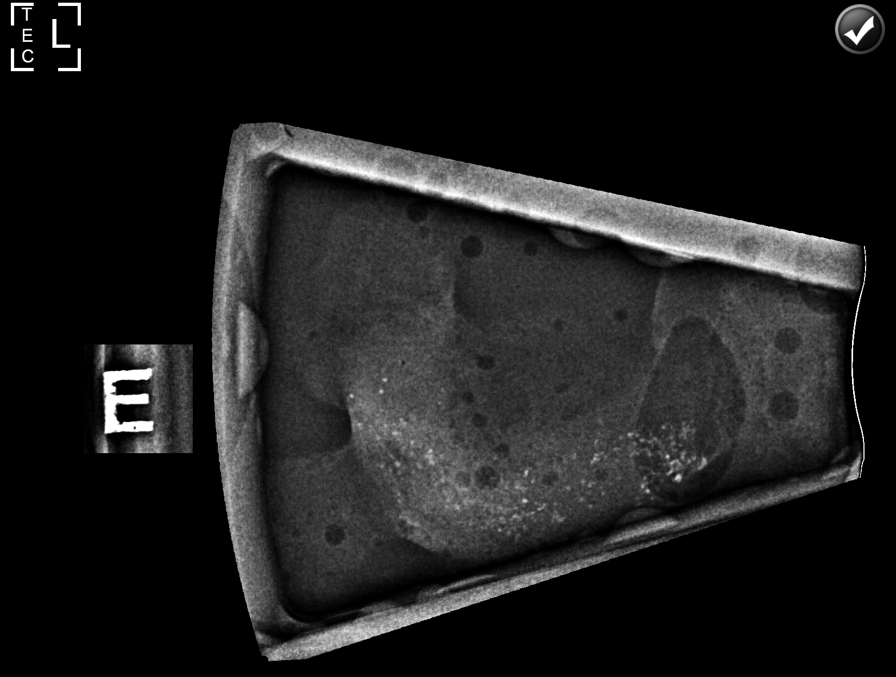

[L (2 of 6)]
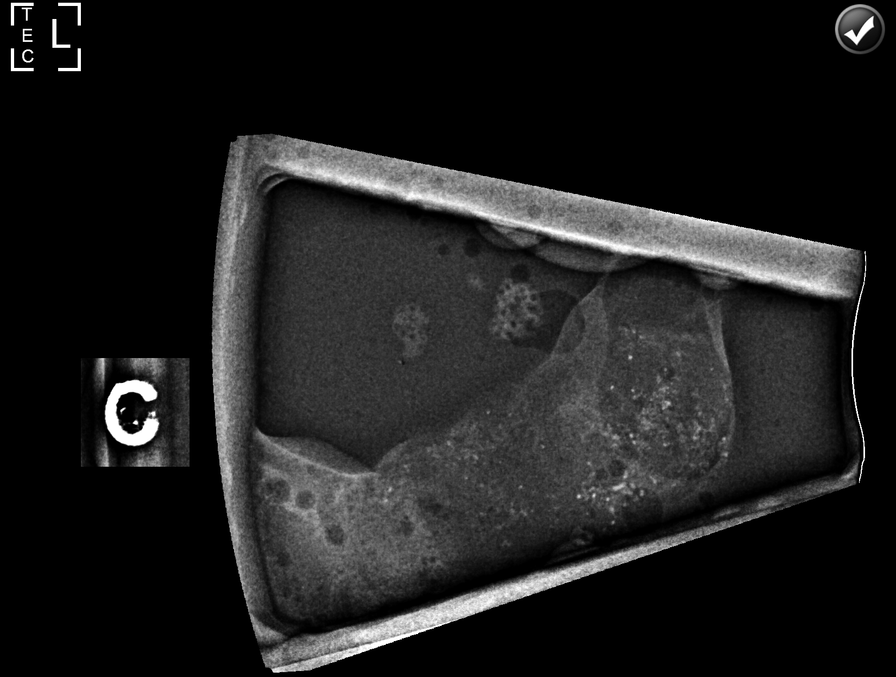

[L (3 of 6)]
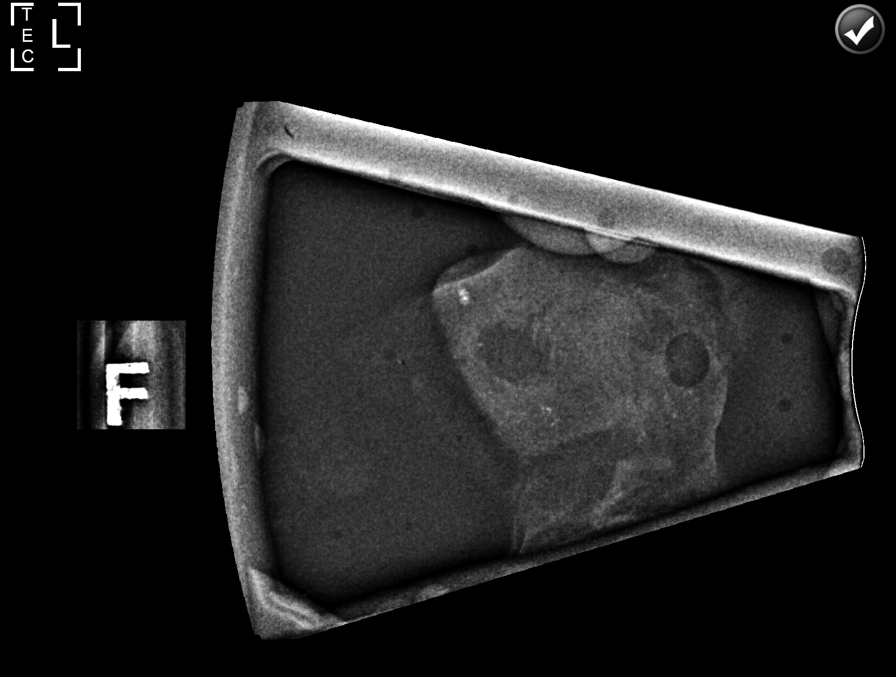

[L (4 of 6)]
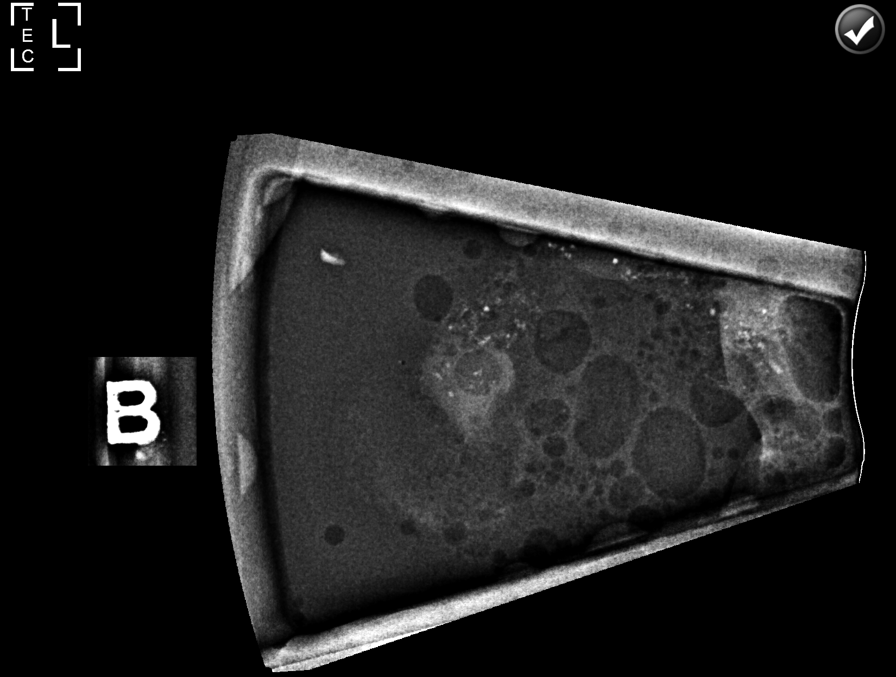

[L (5 of 6)]
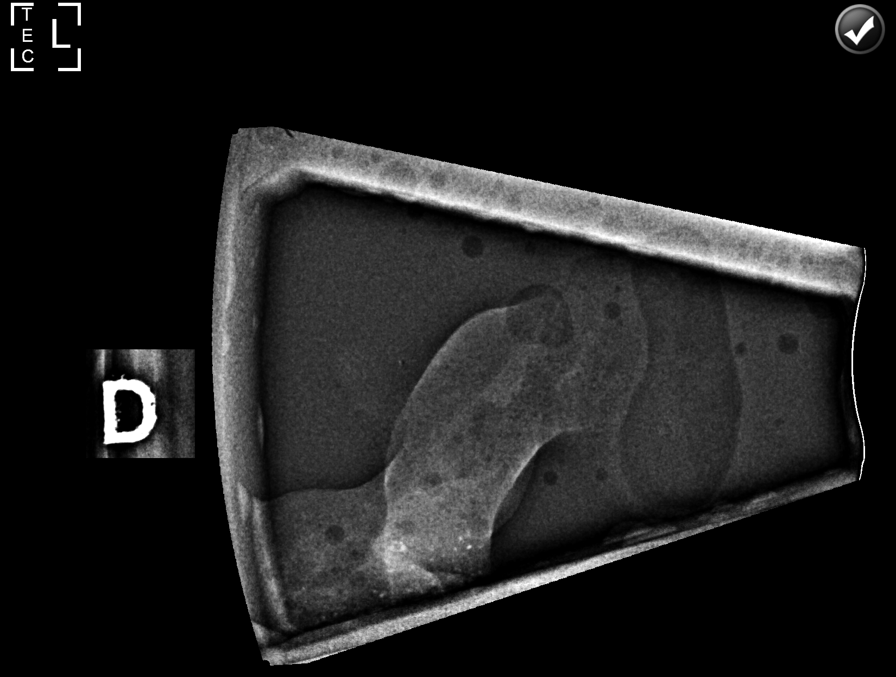

[L (6 of 6)]
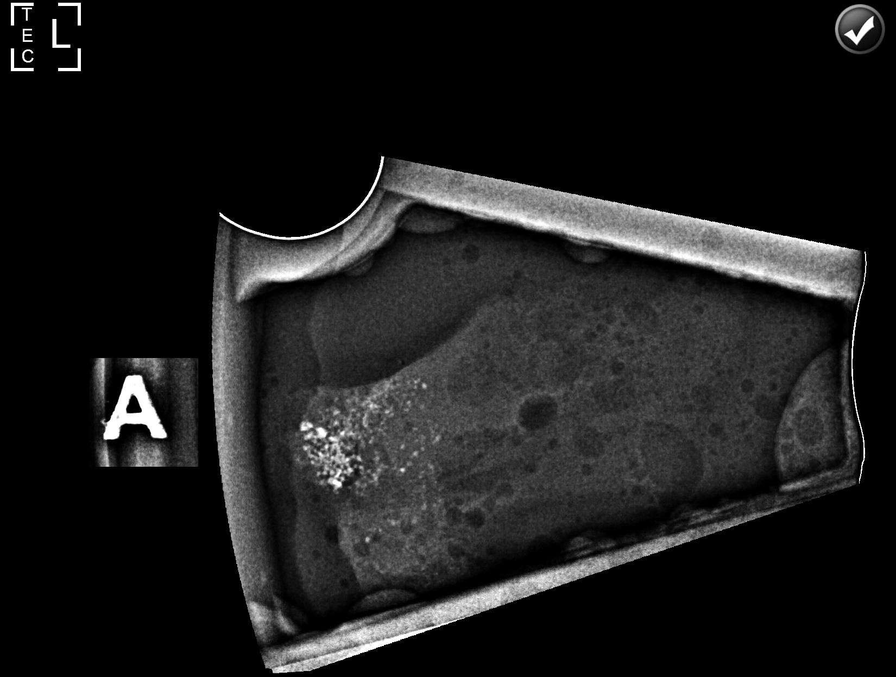

[L ML (1 of 2)]
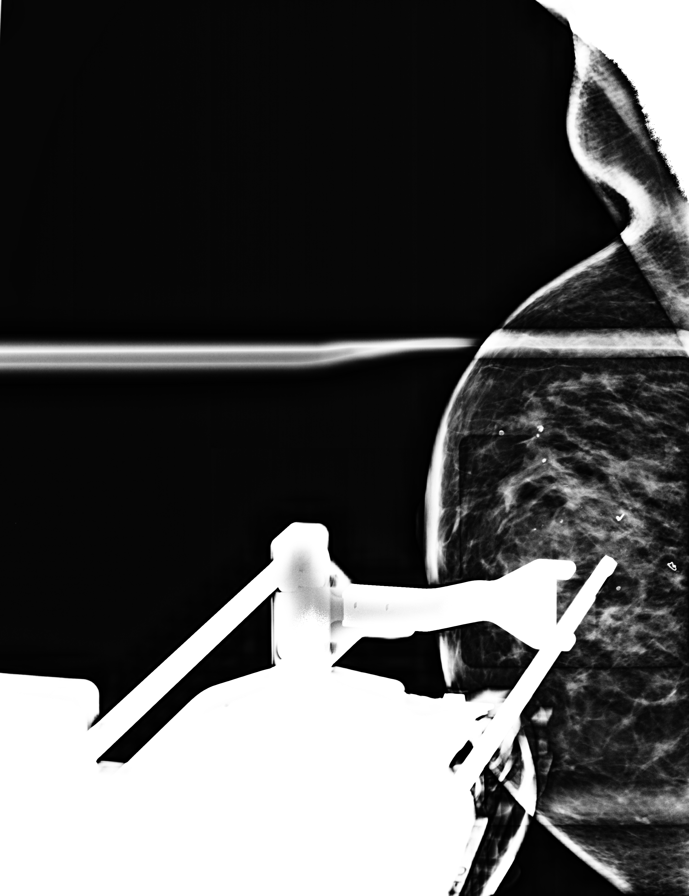

[L ML (2 of 2)]
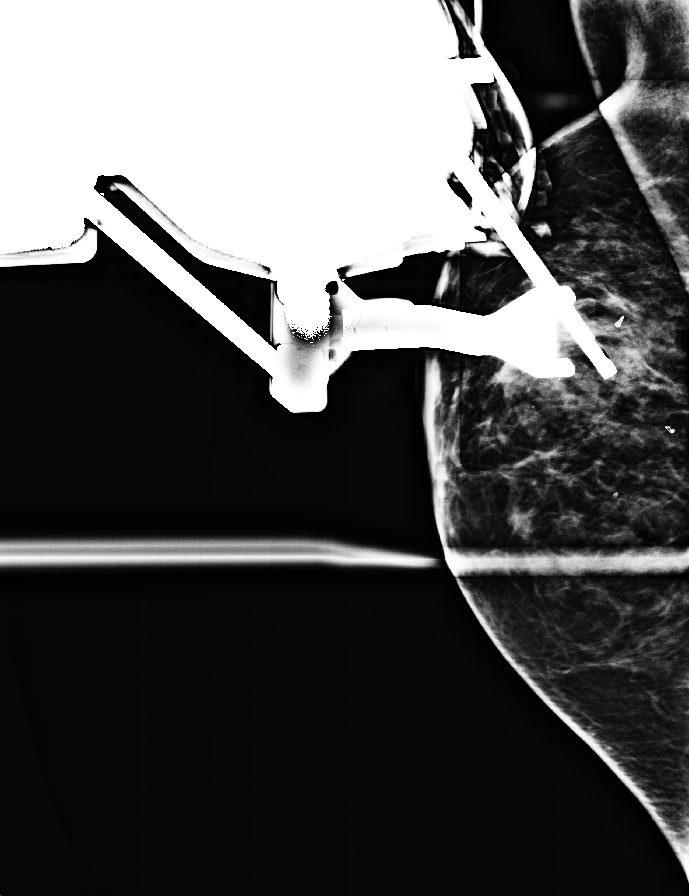

[8 of 24 positions shown; findings below may reference images not displayed]



Using sterile technique and 1% Lidocaine as local anesthetic, under
stereotactic guidance, a 9 gauge vacuum assisted device was used to
perform core needle biopsy of calcifications in the upper inner
quadrant of the left breast using a medial approach. Specimen
radiograph was performed showing representative calcifications.
Specimens with calcifications are identified for pathology.

Lesion quadrant: Upper inner quadrant

At the conclusion of the procedure, coil shaped tissue marker clip
was deployed into the biopsy cavity. Follow-up 2-view mammogram was
performed and dictated separately.
IMPRESSION: Stereotactic-guided biopsy of segmental microcalcifications in the
left breast. No apparent complications.

ADDENDUM:
Pathology revealed FIBROCYSTIC CHANGE WITH CALCIFICATIONS of the
Left breast, medial. This was found to be concordant by Dr. Abdidob
Castor.

Pathology results were discussed with the patient by telephone. The
patient reported doing well after the biopsy with tenderness at the
site. Post biopsy instructions and care were reviewed and questions
were answered. The patient was encouraged to call The [REDACTED]

The patient was instructed to return for annual screening
mammography and informed a reminder notice would be sent regarding
this appointment.

Pathology results reported by Valerks Alvere, RN on 04/12/2019.



Using sterile technique and 1% Lidocaine as local anesthetic, under
stereotactic guidance, a 9 gauge vacuum assisted device was used to
perform core needle biopsy of calcifications in the upper inner
quadrant of the left breast using a medial approach. Specimen
radiograph was performed showing representative calcifications.
Specimens with calcifications are identified for pathology.

Lesion quadrant: Upper inner quadrant

At the conclusion of the procedure, coil shaped tissue marker clip
was deployed into the biopsy cavity. Follow-up 2-view mammogram was
performed and dictated separately.
IMPRESSION: Stereotactic-guided biopsy of segmental microcalcifications in the
left breast. No apparent complications.

## 2020-03-01 ENCOUNTER — Other Ambulatory Visit: Payer: Self-pay | Admitting: Internal Medicine

## 2020-03-01 DIAGNOSIS — Z1231 Encounter for screening mammogram for malignant neoplasm of breast: Secondary | ICD-10-CM

## 2020-03-21 DIAGNOSIS — D3121 Benign neoplasm of right retina: Secondary | ICD-10-CM | POA: Diagnosis not present

## 2020-03-21 DIAGNOSIS — H52223 Regular astigmatism, bilateral: Secondary | ICD-10-CM | POA: Diagnosis not present

## 2020-03-21 DIAGNOSIS — H5201 Hypermetropia, right eye: Secondary | ICD-10-CM | POA: Diagnosis not present

## 2020-03-21 DIAGNOSIS — H524 Presbyopia: Secondary | ICD-10-CM | POA: Diagnosis not present

## 2020-03-28 ENCOUNTER — Other Ambulatory Visit: Payer: Self-pay

## 2020-03-28 ENCOUNTER — Ambulatory Visit
Admission: RE | Admit: 2020-03-28 | Discharge: 2020-03-28 | Disposition: A | Discharge status: Home / Self Care | Payer: PPO | Source: Ambulatory Visit | Attending: Internal Medicine | Admitting: Internal Medicine

## 2020-03-28 DIAGNOSIS — Z1231 Encounter for screening mammogram for malignant neoplasm of breast: Secondary | ICD-10-CM | POA: Diagnosis not present

## 2020-03-28 DIAGNOSIS — N390 Urinary tract infection, site not specified: Secondary | ICD-10-CM | POA: Diagnosis not present

## 2020-04-11 ENCOUNTER — Ambulatory Visit: Payer: PPO | Attending: Internal Medicine

## 2020-04-11 DIAGNOSIS — Z23 Encounter for immunization: Secondary | ICD-10-CM

## 2020-04-11 NOTE — Progress Notes (Signed)
   Covid-19 Vaccination Clinic  Name:  [name]    MRN: [id number] DOB: [birth year]  04/11/2020  [name] was observed post Covid-19 immunization for 15 minutes without incident. She was provided with Vaccine Information Sheet and instruction to access the V-Safe system.   [name] was instructed to call 911 with any severe reactions post vaccine: Marland Kitchen Difficulty breathing  . Swelling of face and throat  . A fast heartbeat  . A bad rash all over body  . Dizziness and weakness

## 2020-04-18 DIAGNOSIS — R2689 Other abnormalities of gait and mobility: Secondary | ICD-10-CM | POA: Diagnosis not present

## 2020-04-18 DIAGNOSIS — D333 Benign neoplasm of cranial nerves: Secondary | ICD-10-CM | POA: Diagnosis not present

## 2020-04-18 DIAGNOSIS — R29898 Other symptoms and signs involving the musculoskeletal system: Secondary | ICD-10-CM | POA: Diagnosis not present

## 2020-05-06 DIAGNOSIS — Z23 Encounter for immunization: Secondary | ICD-10-CM | POA: Diagnosis not present

## 2020-06-24 DIAGNOSIS — Z974 Presence of external hearing-aid: Secondary | ICD-10-CM | POA: Diagnosis not present

## 2020-06-24 DIAGNOSIS — D333 Benign neoplasm of cranial nerves: Secondary | ICD-10-CM | POA: Diagnosis not present

## 2020-06-24 DIAGNOSIS — Z882 Allergy status to sulfonamides status: Secondary | ICD-10-CM | POA: Diagnosis not present

## 2020-06-24 DIAGNOSIS — R2689 Other abnormalities of gait and mobility: Secondary | ICD-10-CM | POA: Insufficient documentation

## 2020-06-24 DIAGNOSIS — H903 Sensorineural hearing loss, bilateral: Secondary | ICD-10-CM | POA: Diagnosis not present

## 2020-06-24 DIAGNOSIS — Z9889 Other specified postprocedural states: Secondary | ICD-10-CM | POA: Diagnosis not present

## 2020-06-24 DIAGNOSIS — H9042 Sensorineural hearing loss, unilateral, left ear, with unrestricted hearing on the contralateral side: Secondary | ICD-10-CM | POA: Diagnosis not present

## 2020-06-24 DIAGNOSIS — Z881 Allergy status to other antibiotic agents status: Secondary | ICD-10-CM | POA: Diagnosis not present

## 2020-06-24 DIAGNOSIS — H9312 Tinnitus, left ear: Secondary | ICD-10-CM | POA: Diagnosis not present

## 2020-07-17 DIAGNOSIS — D333 Benign neoplasm of cranial nerves: Secondary | ICD-10-CM | POA: Diagnosis not present

## 2020-07-17 DIAGNOSIS — H9192 Unspecified hearing loss, left ear: Secondary | ICD-10-CM | POA: Diagnosis not present

## 2020-07-17 DIAGNOSIS — D320 Benign neoplasm of cerebral meninges: Secondary | ICD-10-CM | POA: Diagnosis not present

## 2020-07-17 DIAGNOSIS — Z923 Personal history of irradiation: Secondary | ICD-10-CM | POA: Diagnosis not present

## 2020-07-17 DIAGNOSIS — R2689 Other abnormalities of gait and mobility: Secondary | ICD-10-CM | POA: Diagnosis not present

## 2020-07-18 DIAGNOSIS — M7552 Bursitis of left shoulder: Secondary | ICD-10-CM | POA: Diagnosis not present

## 2020-07-18 DIAGNOSIS — M542 Cervicalgia: Secondary | ICD-10-CM | POA: Diagnosis not present

## 2020-08-19 DIAGNOSIS — M542 Cervicalgia: Secondary | ICD-10-CM | POA: Diagnosis not present

## 2020-08-29 DIAGNOSIS — Z79899 Other long term (current) drug therapy: Secondary | ICD-10-CM | POA: Diagnosis not present

## 2020-08-29 DIAGNOSIS — M810 Age-related osteoporosis without current pathological fracture: Secondary | ICD-10-CM | POA: Diagnosis not present

## 2020-08-29 DIAGNOSIS — I10 Essential (primary) hypertension: Secondary | ICD-10-CM | POA: Diagnosis not present

## 2020-08-29 DIAGNOSIS — G609 Hereditary and idiopathic neuropathy, unspecified: Secondary | ICD-10-CM | POA: Diagnosis not present

## 2020-09-02 DIAGNOSIS — M542 Cervicalgia: Secondary | ICD-10-CM | POA: Diagnosis not present

## 2020-09-05 DIAGNOSIS — D696 Thrombocytopenia, unspecified: Secondary | ICD-10-CM | POA: Diagnosis not present

## 2020-09-05 DIAGNOSIS — I10 Essential (primary) hypertension: Secondary | ICD-10-CM | POA: Diagnosis not present

## 2020-09-05 DIAGNOSIS — G9009 Other idiopathic peripheral autonomic neuropathy: Secondary | ICD-10-CM | POA: Diagnosis not present

## 2020-09-05 DIAGNOSIS — M810 Age-related osteoporosis without current pathological fracture: Secondary | ICD-10-CM | POA: Diagnosis not present

## 2020-09-09 DIAGNOSIS — M542 Cervicalgia: Secondary | ICD-10-CM | POA: Diagnosis not present

## 2020-10-01 DIAGNOSIS — M542 Cervicalgia: Secondary | ICD-10-CM | POA: Diagnosis not present

## 2020-10-03 ENCOUNTER — Telehealth: Payer: Self-pay | Admitting: Neurology

## 2020-10-03 NOTE — Telephone Encounter (Signed)
This patient is requesting a provider switch to Dr. Leta Baptist for management of her care. She is being referred by Dr. Laroy Apple at [facility] for ataxia with a history of Charcot Marie Tooth and recent gamma knife surgery. She last saw Dr. Jannifer Franklin in 2017 for neuropathy. Neurosurgery notes can be found in [facility]. Please advise if this is acceptable. Thank you

## 2020-10-04 NOTE — Telephone Encounter (Signed)
If okay with the patient, I will be happy to see.

## 2020-10-08 DIAGNOSIS — G600 Hereditary motor and sensory neuropathy: Secondary | ICD-10-CM | POA: Diagnosis not present

## 2020-10-08 DIAGNOSIS — R270 Ataxia, unspecified: Secondary | ICD-10-CM | POA: Diagnosis not present

## 2020-10-08 DIAGNOSIS — M47892 Other spondylosis, cervical region: Secondary | ICD-10-CM | POA: Diagnosis not present

## 2020-10-15 DIAGNOSIS — M47892 Other spondylosis, cervical region: Secondary | ICD-10-CM | POA: Diagnosis not present

## 2020-10-15 DIAGNOSIS — G600 Hereditary motor and sensory neuropathy: Secondary | ICD-10-CM | POA: Diagnosis not present

## 2020-10-15 DIAGNOSIS — R270 Ataxia, unspecified: Secondary | ICD-10-CM | POA: Diagnosis not present

## 2020-10-23 DIAGNOSIS — N83291 Other ovarian cyst, right side: Secondary | ICD-10-CM | POA: Diagnosis not present

## 2020-10-23 DIAGNOSIS — M4800 Spinal stenosis, site unspecified: Secondary | ICD-10-CM | POA: Diagnosis not present

## 2020-10-23 DIAGNOSIS — H933X2 Disorders of left acoustic nerve: Secondary | ICD-10-CM | POA: Diagnosis not present

## 2020-11-05 DIAGNOSIS — M5441 Lumbago with sciatica, right side: Secondary | ICD-10-CM | POA: Diagnosis not present

## 2020-11-05 DIAGNOSIS — G8929 Other chronic pain: Secondary | ICD-10-CM | POA: Diagnosis not present

## 2020-11-05 DIAGNOSIS — M5442 Lumbago with sciatica, left side: Secondary | ICD-10-CM | POA: Diagnosis not present

## 2020-11-05 DIAGNOSIS — M4316 Spondylolisthesis, lumbar region: Secondary | ICD-10-CM | POA: Diagnosis not present

## 2020-11-19 ENCOUNTER — Ambulatory Visit: Payer: PPO | Admitting: Adult Health

## 2020-11-19 ENCOUNTER — Other Ambulatory Visit: Payer: Self-pay

## 2020-11-19 ENCOUNTER — Encounter: Payer: Self-pay | Admitting: Adult Health

## 2020-11-19 VITALS — BP 148/72 | HR 79 | Ht 65.0 in | Wt 152.8 lb

## 2020-11-19 DIAGNOSIS — N9983 Residual ovary syndrome: Secondary | ICD-10-CM | POA: Insufficient documentation

## 2020-11-19 DIAGNOSIS — N941 Unspecified dyspareunia: Secondary | ICD-10-CM | POA: Diagnosis not present

## 2020-11-19 DIAGNOSIS — N898 Other specified noninflammatory disorders of vagina: Secondary | ICD-10-CM

## 2020-11-19 DIAGNOSIS — R140 Abdominal distension (gaseous): Secondary | ICD-10-CM | POA: Diagnosis not present

## 2020-11-19 NOTE — Progress Notes (Signed)
  Subjective:     Patient ID: [name], female   DOB: 12-06-1940, 80 y.o.   MRN: [id number]  HPI [name] is a 80 year old white female, married, sp hysterectomy and BSO and has ovarian remnant, has had brown vaginal discharge in the morning, and bloating and pain with sex. Has sex about twice a month.  She had cyst drained years ago by Dr Aldean Ast.  PCP is Dr Willey Blade.  Review of Systems +Brown discharge +bloating +pain with sex Denies any problems with bowels or urination Reviewed past medical,surgical, social and family history. Reviewed medications and allergies.      Objective:   Physical Exam BP (!) 148/72 (BP Location: Right Arm, Patient Position: Sitting, Cuff Size: Normal)   Pulse 79   Ht 5\' 5"  (1.651 m)   Wt 152 lb 12.8 oz (69.3 kg)   BMI 25.43 kg/m  Skin warm and dry.Pelvic: external genitalia is normal in appearance no lesions, vagina:pale with loss of moisture and rugae, vaginal cuff has no lesions and no discharge today,urethra has no lesions or masses noted, cervix and uterus are absent, adnexa: no masses or tenderness noted. Bladder is non tender and no masses felt.    AA is 0 Fall risk is moderate PHQ 9 score is 0 GAD 7 score is 0  [facility] - 11/19/20 1355      Pregnancy Intention Screening   Does the patient want to become pregnant in the next year? No    Does the patient's partner want to become pregnant in the next year? No    Would the patient like to discuss contraceptive options today? No      Contraception Wrap Up   Current Method Female Sterilization   hyst   End Method Female Sterilization   hyst   Contraception Counseling Provided No         Examination chaperoned by Engineer, materials  Assessment:     1. Vaginal discharge  2. Dyspareunia, female Try astroglide with sex, put on him too  3. Bloating Will get GYN [place]   4. Ovarian remnant syndrome Will get GYN [place] to assess    Plan:      Will get GYN [place] to assess for ovarian remnant and  see me after

## 2020-11-20 DIAGNOSIS — M48061 Spinal stenosis, lumbar region without neurogenic claudication: Secondary | ICD-10-CM | POA: Diagnosis not present

## 2020-11-20 DIAGNOSIS — M5126 Other intervertebral disc displacement, lumbar region: Secondary | ICD-10-CM | POA: Diagnosis not present

## 2020-11-20 DIAGNOSIS — M47816 Spondylosis without myelopathy or radiculopathy, lumbar region: Secondary | ICD-10-CM | POA: Diagnosis not present

## 2020-11-20 DIAGNOSIS — M5441 Lumbago with sciatica, right side: Secondary | ICD-10-CM | POA: Diagnosis not present

## 2020-11-29 DIAGNOSIS — M4316 Spondylolisthesis, lumbar region: Secondary | ICD-10-CM | POA: Diagnosis not present

## 2020-11-29 DIAGNOSIS — I10 Essential (primary) hypertension: Secondary | ICD-10-CM | POA: Diagnosis not present

## 2020-11-29 DIAGNOSIS — M48062 Spinal stenosis, lumbar region with neurogenic claudication: Secondary | ICD-10-CM | POA: Diagnosis not present

## 2020-11-29 DIAGNOSIS — Z6825 Body mass index (BMI) 25.0-25.9, adult: Secondary | ICD-10-CM | POA: Diagnosis not present

## 2020-12-04 ENCOUNTER — Ambulatory Visit: Payer: PPO | Admitting: Neurology

## 2020-12-17 ENCOUNTER — Other Ambulatory Visit: Payer: Self-pay | Admitting: Adult Health

## 2020-12-17 DIAGNOSIS — Z8742 Personal history of other diseases of the female genital tract: Secondary | ICD-10-CM

## 2020-12-18 ENCOUNTER — Ambulatory Visit: Payer: PPO | Admitting: Adult Health

## 2020-12-18 ENCOUNTER — Encounter: Payer: Self-pay | Admitting: Adult Health

## 2020-12-18 ENCOUNTER — Ambulatory Visit (INDEPENDENT_AMBULATORY_CARE_PROVIDER_SITE_OTHER): Payer: PPO

## 2020-12-18 ENCOUNTER — Other Ambulatory Visit: Payer: Self-pay

## 2020-12-18 VITALS — BP 139/77 | HR 69 | Ht 65.0 in | Wt 154.0 lb

## 2020-12-18 DIAGNOSIS — Z8742 Personal history of other diseases of the female genital tract: Secondary | ICD-10-CM | POA: Diagnosis not present

## 2020-12-18 DIAGNOSIS — N9983 Residual ovary syndrome: Secondary | ICD-10-CM

## 2020-12-18 DIAGNOSIS — R140 Abdominal distension (gaseous): Secondary | ICD-10-CM

## 2020-12-18 DIAGNOSIS — N941 Unspecified dyspareunia: Secondary | ICD-10-CM

## 2020-12-18 NOTE — Progress Notes (Signed)
  Subjective:     Patient ID: [name], female   DOB: 1941/03/06, 80 y.o.   MRN: [id number]  HPI [name] is a 80 year old white female, married sp hysterectomy with BSO, in for [place] has had ovarian remnant and bloating. PCP is Dr Willey Blade.  Review of Systems +bloating Sex was better with astroglide  Reviewed past medical,surgical, social and family history. Reviewed medications and allergies.     Objective:   Physical Exam BP 139/77 (BP Location: Right Arm, Patient Position: Sitting, Cuff Size: Normal)   Pulse 69   Ht 5\' 5"  (1.651 m)   Wt 154 lb (69.9 kg)   BMI 25.63 kg/m   Talk only: US showed normal vaginal cuff, adnexa was normal and no ovarian remnant seen.  [facility] - 12/18/20 1028      Pregnancy Intention Screening   Does the patient want to become pregnant in the next year? No    Does the patient's partner want to become pregnant in the next year? No    Would the patient like to discuss contraceptive options today? No      Contraception Wrap Up   Current Method --   hyst   End Method --   hyst   Contraception Counseling Provided No             Assessment:     1. Ovarian remnant syndrome No ovary remnant seen   2. Bloating Watch diet to see if can associate with any foods.may be GI related   3. Dyspareunia, female Better with astro glide,so continue to use     Plan:     Follow up prn

## 2020-12-18 NOTE — Progress Notes (Signed)
PELVIC US TA/TV: normal vaginal cuff,ovaries not visualized,bilat adnexa's WNL,no free fluid,no pain during ultrasound   Chaperone Peggy

## 2021-02-17 ENCOUNTER — Other Ambulatory Visit: Payer: Self-pay | Admitting: Internal Medicine

## 2021-02-17 DIAGNOSIS — Z1231 Encounter for screening mammogram for malignant neoplasm of breast: Secondary | ICD-10-CM

## 2021-02-27 DIAGNOSIS — D75839 Thrombocytosis, unspecified: Secondary | ICD-10-CM | POA: Diagnosis not present

## 2021-03-06 DIAGNOSIS — I10 Essential (primary) hypertension: Secondary | ICD-10-CM | POA: Diagnosis not present

## 2021-03-06 DIAGNOSIS — D75839 Thrombocytosis, unspecified: Secondary | ICD-10-CM | POA: Diagnosis not present

## 2021-03-06 DIAGNOSIS — M48062 Spinal stenosis, lumbar region with neurogenic claudication: Secondary | ICD-10-CM | POA: Diagnosis not present

## 2021-04-04 DIAGNOSIS — Z23 Encounter for immunization: Secondary | ICD-10-CM | POA: Diagnosis not present

## 2021-04-10 ENCOUNTER — Ambulatory Visit: Payer: PPO

## 2021-04-23 ENCOUNTER — Ambulatory Visit
Admission: RE | Admit: 2021-04-23 | Discharge: 2021-04-23 | Disposition: A | Discharge status: Home / Self Care | Payer: PPO | Source: Ambulatory Visit | Attending: Internal Medicine | Admitting: Internal Medicine

## 2021-04-23 ENCOUNTER — Other Ambulatory Visit: Payer: Self-pay

## 2021-04-23 DIAGNOSIS — Z1231 Encounter for screening mammogram for malignant neoplasm of breast: Secondary | ICD-10-CM

## 2021-05-06 DIAGNOSIS — Z23 Encounter for immunization: Secondary | ICD-10-CM | POA: Diagnosis not present

## 2021-06-10 DIAGNOSIS — L209 Atopic dermatitis, unspecified: Secondary | ICD-10-CM | POA: Diagnosis not present

## 2021-07-14 ENCOUNTER — Ambulatory Visit
Admission: EM | Admit: 2021-07-14 | Discharge: 2021-07-14 | Disposition: A | Discharge status: Home / Self Care | Payer: PPO | Attending: Urgent Care | Admitting: Urgent Care

## 2021-07-14 ENCOUNTER — Other Ambulatory Visit: Payer: Self-pay

## 2021-07-14 ENCOUNTER — Encounter: Payer: Self-pay | Admitting: Emergency Medicine

## 2021-07-14 DIAGNOSIS — R0789 Other chest pain: Secondary | ICD-10-CM

## 2021-07-14 DIAGNOSIS — R051 Acute cough: Secondary | ICD-10-CM

## 2021-07-14 DIAGNOSIS — J018 Other acute sinusitis: Secondary | ICD-10-CM | POA: Diagnosis not present

## 2021-07-14 DIAGNOSIS — J3489 Other specified disorders of nose and nasal sinuses: Secondary | ICD-10-CM | POA: Diagnosis not present

## 2021-07-14 MED ORDER — PSEUDOEPHEDRINE HCL 30 MG PO TABS: 30.0000 mg | ORAL_TABLET | Freq: Three times a day (TID) | ORAL | 0 refills | Status: DC | PRN

## 2021-07-14 MED ORDER — AMOXICILLIN-POT CLAVULANATE 875-125 MG PO TABS: 1.0000 | ORAL_TABLET | Freq: Two times a day (BID) | ORAL | 0 refills | Status: DC

## 2021-07-14 MED ORDER — PROMETHAZINE-DM 6.25-15 MG/5ML PO SYRP: 5.0000 mL | ORAL_SOLUTION | Freq: Every evening | ORAL | 0 refills | Status: DC | PRN

## 2021-07-14 MED ORDER — BENZONATATE 100 MG PO CAPS: 100.0000 mg | ORAL_CAPSULE | Freq: Three times a day (TID) | ORAL | 0 refills | Status: DC | PRN

## 2021-07-14 MED ORDER — CETIRIZINE HCL 10 MG PO TABS: 10.0000 mg | ORAL_TABLET | Freq: Every day | ORAL | 0 refills | Status: DC

## 2021-07-14 NOTE — ED Provider Notes (Signed)
[name]   MRN: [id number] DOB: 1941-01-25  Subjective:   [name] is a 81 y.o. female presenting for 2 week history of sinus drainage, sinus congestion, chest tightness, shortness of breath. No chest pain, wheezing. No history of asthma. No smoking. Has a history of bronchitis, has remote history of this.   No current facility-administered medications for this encounter.  Current Outpatient Medications:    acetaminophen (TYLENOL) 500 MG tablet, Take by mouth every 6 (six) hours as needed for pain., Disp: , Rfl:    alendronate (FOSAMAX) 70 MG tablet, Take 70 mg by mouth once a week., Disp: , Rfl:    amLODipine (NORVASC) 5 MG tablet, Take 5 mg by mouth daily., Disp: , Rfl:    clonazePAM (KLONOPIN) 0.5 MG tablet, Take 0.5 mg by mouth daily as needed for anxiety., Disp: , Rfl:    pregabalin (LYRICA) 150 MG capsule, Take 1 capsule (150 mg total) by mouth 3 (three) times daily., Disp: 90 capsule, Rfl: 5   zolpidem (AMBIEN) 5 MG tablet, Take 5 mg by mouth at bedtime as needed., Disp: , Rfl:    Allergies  Allergen Reactions   Prednisone    Ciprofloxacin Hives and Rash   Sulfonamide Derivatives Hives and Rash    Past Medical History:  Diagnosis Date   Abnormality of gait 02/19/2016   Anxiety    Benign positional vertigo    Broken foot    left leg and toe   Bronchitis    Charcot Marie Tooth muscular atrophy    Hemoglobin low    and hemocrit   Hyperlipidemia    Hypertension    Iron deficiency anemia    Osteoporosis    Peripheral neuropathy    Pneumonia    Prepyloric ulcer    RLQ abdominal pain 11/24/2013   Hx of ovarian remnant ,now has pain down right leg  Will get [place]   Shingles    Right V1 distribution     Past Surgical History:  Procedure Laterality Date   [place]   brain tumor     BREAST EXCISIONAL BIOPSY Left 2016   benign x 2   BREAST LUMPECTOMY WITH RADIOACTIVE SEED LOCALIZATION  Left 04/02/2015   Procedure: BREAST LUMPECTOMY WITH RADIOACTIVE SEED LOCALIZATION;  Surgeon: Fanny Skates, MD;  Location: [facility];  Service: General;  Laterality: Left;   CATARACT EXTRACTION Bilateral    CHOLECYSTECTOMY     COLONOSCOPY     cameria capsule   COLONOSCOPY N/A 06/22/2013   Procedure: COLONOSCOPY;  Surgeon: Daneil Dolin, MD;  [facility];  Service: Endoscopy;  Laterality: N/A;  1:00 PM   FOOT SURGERY  06/11/06   rt.     MASTOIDECTOMY     retroperitoneal cyst     drainage of   TOTAL ABDOMINAL HYSTERECTOMY W/ BILATERAL SALPINGOOPHORECTOMY  1996    Family History  Adopted: Yes    Social History   Tobacco Use   Smoking status: Never   Smokeless tobacco: Never  Vaping Use   Vaping Use: Never used  Substance Use Topics   Alcohol use: No   Drug use: No    ROS   Objective:   Vitals: BP (!) 154/76    Pulse 80    Temp 98 F (36.7 C)    Resp 20    SpO2 96%   Physical Exam Constitutional:      General: She  is not in acute distress.    Appearance: Normal appearance. She is well-developed. She is not ill-appearing, toxic-appearing or diaphoretic.  HENT:     Head: Normocephalic and atraumatic.     Right Ear: External ear normal.     Left Ear: External ear normal.     Nose: Congestion present. No rhinorrhea.     Mouth/Throat:     Mouth: Mucous membranes are moist.     Pharynx: No oropharyngeal exudate or posterior oropharyngeal erythema.     Comments: Thick streaks of post-nasal drainage overlying pharynx.  Eyes:     General: No scleral icterus.       Right eye: No discharge.        Left eye: No discharge.     Extraocular Movements: Extraocular movements intact.  Cardiovascular:     Rate and Rhythm: Normal rate and regular rhythm.     Pulses: Normal pulses.     Heart sounds: Normal heart sounds. No murmur heard.   No friction rub. No gallop.  Pulmonary:     Effort: Pulmonary effort is normal. No respiratory distress.      Breath sounds: Normal breath sounds. No stridor. No wheezing, rhonchi or rales.  Skin:    General: Skin is warm and dry.     Findings: No rash.  Neurological:     Mental Status: She is alert and oriented to person, place, and time.  Psychiatric:        Mood and Affect: Mood normal.        Behavior: Behavior normal.        Thought Content: Thought content normal.    Assessment and Plan :   PDMP not reviewed this encounter.  1. Acute non-recurrent sinusitis of other sinus   2. Sinus pressure   3. Acute cough   4. Chest tightness    Deferred imaging given clear cardiopulmonary exam, hemodynamically stable vital signs. Deferred respiratory testing given timing of her illness. Will start empiric treatment for sinusitis with Augmentin.  Recommended supportive care otherwise including the use of oral antihistamine, decongestant. Counseled patient on potential for adverse effects with medications prescribed/recommended today, ER and return-to-clinic precautions discussed, patient verbalized understanding.    Jaynee Eagles, [place] 07/14/21 1334

## 2021-07-14 NOTE — ED Triage Notes (Signed)
Pt here with URI sx without fever for 2 weeks.

## 2021-07-16 DIAGNOSIS — D320 Benign neoplasm of cerebral meninges: Secondary | ICD-10-CM | POA: Diagnosis not present

## 2021-07-16 DIAGNOSIS — D333 Benign neoplasm of cranial nerves: Secondary | ICD-10-CM | POA: Diagnosis not present

## 2021-07-16 DIAGNOSIS — Z923 Personal history of irradiation: Secondary | ICD-10-CM | POA: Diagnosis not present

## 2021-07-16 DIAGNOSIS — R2681 Unsteadiness on feet: Secondary | ICD-10-CM | POA: Diagnosis not present

## 2021-09-01 DIAGNOSIS — G600 Hereditary motor and sensory neuropathy: Secondary | ICD-10-CM | POA: Diagnosis not present

## 2021-09-01 DIAGNOSIS — I10 Essential (primary) hypertension: Secondary | ICD-10-CM | POA: Diagnosis not present

## 2021-09-01 DIAGNOSIS — G4700 Insomnia, unspecified: Secondary | ICD-10-CM | POA: Diagnosis not present

## 2021-09-01 DIAGNOSIS — Z79899 Other long term (current) drug therapy: Secondary | ICD-10-CM | POA: Diagnosis not present

## 2021-09-08 DIAGNOSIS — I10 Essential (primary) hypertension: Secondary | ICD-10-CM | POA: Diagnosis not present

## 2021-09-08 DIAGNOSIS — M48061 Spinal stenosis, lumbar region without neurogenic claudication: Secondary | ICD-10-CM | POA: Diagnosis not present

## 2021-09-08 DIAGNOSIS — E785 Hyperlipidemia, unspecified: Secondary | ICD-10-CM | POA: Diagnosis not present

## 2021-09-09 DIAGNOSIS — M5441 Lumbago with sciatica, right side: Secondary | ICD-10-CM | POA: Diagnosis not present

## 2021-09-09 DIAGNOSIS — M4316 Spondylolisthesis, lumbar region: Secondary | ICD-10-CM | POA: Diagnosis not present

## 2021-10-09 DIAGNOSIS — M48062 Spinal stenosis, lumbar region with neurogenic claudication: Secondary | ICD-10-CM | POA: Diagnosis not present

## 2021-11-13 ENCOUNTER — Ambulatory Visit: Payer: PPO | Admitting: Gastroenterology

## 2021-11-13 ENCOUNTER — Encounter: Payer: Self-pay | Admitting: Gastroenterology

## 2021-11-13 VITALS — BP 138/72 | HR 82 | Temp 97.5°F | Ht 65.0 in | Wt 153.8 lb

## 2021-11-13 DIAGNOSIS — K219 Gastro-esophageal reflux disease without esophagitis: Secondary | ICD-10-CM

## 2021-11-13 DIAGNOSIS — K582 Mixed irritable bowel syndrome: Secondary | ICD-10-CM | POA: Diagnosis not present

## 2021-11-13 DIAGNOSIS — R1110 Vomiting, unspecified: Secondary | ICD-10-CM | POA: Diagnosis not present

## 2021-11-13 DIAGNOSIS — M48062 Spinal stenosis, lumbar region with neurogenic claudication: Secondary | ICD-10-CM | POA: Diagnosis not present

## 2021-11-13 MED ORDER — OMEPRAZOLE 20 MG PO CPDR: 20.0000 mg | DELAYED_RELEASE_CAPSULE | Freq: Every day | ORAL | 3 refills | Status: DC

## 2021-11-13 NOTE — Patient Instructions (Addendum)
I sent in omeprazole 20 mg daily for you to take 30 minutes prior to dinner. ?You may take the famotidine as needed once a day for breakthrough symptoms.  ? ?To begin taking Benefiber 2 teaspoons daily. ? ?If you begin to have persistent diarrhea, more than 2-3 loose stools a day for multiple days in a row please contact the office so we can do some stool sampling. ?

## 2021-11-13 NOTE — Progress Notes (Signed)
? ? ?GI Office Note   ? ?Referring Provider: Asencion Noble, MD ?Primary Care Physician:  Asencion Noble, MD  ?Primary GI: Dr. Gala Romney ? ?Chief Complaint  ? ?Chief Complaint  ?Patient presents with  ? Diarrhea  ?  Vomiting last week   ? ? ? ?History of Present Illness  ? ?[name] is a 81 y.o. female presenting today at the request of Asencion Noble, MD for intermittent diarrhea with vomiting.  ? ? ?Today: ? ?Diarrhea ?Patient complains of diarrhea. Symptoms have been present for approximately  20  years. The symptoms are intermittent, occurring about every 3-4 months. Stool frequency is approximately several per day. Diarrhea does not occur at night. The patient has noted no bleeding associated with bowel movements. The patient reports the following symptoms: bloating, cramping unrelieved by defecation, diarrhea alternating with constipation, fecal urgency, and loose stools. The patient denies the following symptoms: fecal incontinence, fever, melena, night sweats, nocturnal diarrhea, pus with stools, red blood with stools, and weight loss. The patient currently denies significant abdominal pain or discomfort, it only happens when severe boats of diarrhea occur.   Relationship to food: the diarrhea does not improve with fasting. Relationship to medications: the patient denies any relationship to medications. Other risk factors: none. Therapy tried so far: none. ? ?Patient reports that sometimes she has vomiting associated with her diarrhea episodes.  However last vomiting episode was 3 weeks ago without diarrhea.  She reports she has stayed tomatoes and that not long after vomited and the contents were burgundy in color.  She thought maybe it was blood.  Reports her husband stated that it looked just like this to tomatoes.  She reports that most of the time she will have abdominal pain with the vomiting and diarrhea that last for about 8 hours at a time, after she feels empty for multiple episodes of diarrhea, her  pain will subside.  Sometimes with the pain she will take her oxycodone that is leftover from her prior surgery.  With these episodes she does report some occasional urgency that has resulted in a couple of accidents/incontinence.  She denies melena, hematochezia, or unintentional weight loss. Bowel habits mixed between constipation and diarrhea: more diarrhea. ?Denies any increase in symptoms since beginning radiation. ? ?She reports that she has taken fiber Gummies (fiber well) but has not taken any additional fiber supplementation.  And is currently unsure how much fiber is in the Gummies that she is taking. ? ?She does report reflux symptoms.  She denies any chest pain or epigastric pain.  However does report a burning sensation in her throat, and a sour taste in her mouth.  Symptoms seem to occur more after dinnertime, sometimes with lying down.  She reports she eats around 6 PM and goes to bed about 11 PM.  Her PCP recommended that she take an over-the-counter pill to help with her symptoms.  She feels like it is famotidine.  She states it may have been slightly helpful but not significantly. ? ? ?Past Medical History:  ?Diagnosis Date  ? Abnormality of gait 02/19/2016  ? Anxiety   ? Benign positional vertigo   ? Broken foot   ? left leg and toe  ? Bronchitis   ? Charcot Marie Tooth muscular atrophy   ? Hemoglobin low   ? and hemocrit  ? Hyperlipidemia   ? Hypertension   ? Iron deficiency anemia   ? Osteoporosis   ? Peripheral neuropathy   ? Pneumonia   ?  Prepyloric ulcer   ? RLQ abdominal pain 11/24/2013  ? Hx of ovarian remnant ,now has pain down right leg  Will get [place]  ? Shingles   ? Right V1 distribution  ? ? ?Past Surgical History:  ?Procedure Laterality Date  ? ABDOMINAL HYSTERECTOMY  1979  ? BILATERAL OOPHORECTOMY    ? 1996  ? brain tumor    ? BREAST EXCISIONAL BIOPSY Left 2016  ? benign x 2  ? BREAST LUMPECTOMY WITH RADIOACTIVE SEED LOCALIZATION Left 04/02/2015  ? Procedure: BREAST LUMPECTOMY WITH  RADIOACTIVE SEED LOCALIZATION;  Surgeon: Fanny Skates, MD;  Location: [facility];  Service: General;  Laterality: Left;  ? CATARACT EXTRACTION Bilateral   ? CHOLECYSTECTOMY    ? COLONOSCOPY    ? cameria capsule  ? COLONOSCOPY N/A 06/22/2013  ? Procedure: COLONOSCOPY;  Surgeon: Daneil Dolin, MD;  [facility];  Service: Endoscopy;  Laterality: N/A;  1:00 PM  ? FOOT SURGERY  06/11/06  ? rt.    ? MASTOIDECTOMY    ? retroperitoneal cyst    ? drainage of  ? TOTAL ABDOMINAL HYSTERECTOMY W/ BILATERAL SALPINGOOPHORECTOMY  1996  ? ? ?Current Outpatient Medications  ?Medication Sig Dispense Refill  ? acetaminophen (TYLENOL) 500 MG tablet Take by mouth every 6 (six) hours as needed for pain.    ? alendronate (FOSAMAX) 70 MG tablet Take 70 mg by mouth once a week.    ? amLODipine (NORVASC) 5 MG tablet Take 5 mg by mouth daily.    ? clonazePAM (KLONOPIN) 0.5 MG tablet Take 0.5 mg by mouth daily as needed for anxiety.    ? pregabalin (LYRICA) 150 MG capsule Take 1 capsule (150 mg total) by mouth 3 (three) times daily. 90 capsule 5  ? pseudoephedrine (SUDAFED) 30 MG tablet Take 1 tablet (30 mg total) by mouth every 8 (eight) hours as needed for congestion. 30 tablet 0  ? zolpidem (AMBIEN) 5 MG tablet Take 5 mg by mouth at bedtime as needed.    ? amoxicillin-clavulanate (AUGMENTIN) 875-125 MG tablet Take 1 tablet by mouth every 12 (twelve) hours. (Patient not taking: Reported on 11/13/2021) 14 tablet 0  ? benzonatate (TESSALON) 100 MG capsule Take 1-2 capsules (100-200 mg total) by mouth 3 (three) times daily as needed for cough. (Patient not taking: Reported on 11/13/2021) 60 capsule 0  ? cetirizine (ZYRTEC ALLERGY) 10 MG tablet Take 1 tablet (10 mg total) by mouth daily. (Patient not taking: Reported on 11/13/2021) 30 tablet 0  ? promethazine-dextromethorphan (PROMETHAZINE-DM) 6.25-15 MG/5ML syrup Take 5 mLs by mouth at bedtime as needed for cough. (Patient not taking: Reported on 11/13/2021) 100 mL 0  ? ?No  current facility-administered medications for this visit.  ? ? ?Allergies as of 11/13/2021 - Review Complete 11/13/2021  ?Allergen Reaction Noted  ? Prednisone  11/05/2020  ? Ciprofloxacin Hives and Rash   ? Sulfonamide derivatives Hives and Rash   ? ? ?Family History  ?Adopted: Yes  ? ? ?Social History  ? ?Socioeconomic History  ? Marital status: Married  ?  Spouse name: Not on file  ? Number of children: 1  ? Years of education: college2  ? Highest education level: Not on file  ?Occupational History  ? Occupation: Retired  ?Tobacco Use  ? Smoking status: Never  ? Smokeless tobacco: Never  ?Vaping Use  ? Vaping Use: Never used  ?Substance and Sexual Activity  ? Alcohol use: No  ? Drug use: No  ? Sexual activity:  Yes  ?  Birth control/protection: Surgical  ?  Comment: hyst  ?Other Topics Concern  ? Not on file  ?Social History Narrative  ? Lives at home, married  ? Right-handed  ? Drinks mostly Gatorade  ? ?Social Determinants of Health  ? ?Financial Resource Strain: Low Risk   ? Difficulty of Paying Living Expenses: Not hard at all  ?Food Insecurity: No Food Insecurity  ? Worried About Charity fundraiser in the Last Year: Never true  ? Ran Out of Food in the Last Year: Never true  ?Transportation Needs: No Transportation Needs  ? Lack of Transportation (Medical): No  ? Lack of Transportation (Non-Medical): No  ?Physical Activity: Insufficiently Active  ? Days of Exercise per Week: 2 days  ? Minutes of Exercise per Session: 30 min  ?Stress: No Stress Concern Present  ? Feeling of Stress : Not at all  ?Social Connections: Moderately Isolated  ? Frequency of Communication with Friends and Family: Three times a week  ? Frequency of Social Gatherings with Friends and Family: Twice a week  ? Attends Religious Services: Never  ? Active Member of Clubs or Organizations: No  ? Attends Archivist Meetings: Never  ? Marital Status: Married  ?Intimate Partner Violence: Not At Risk  ? Fear of Current or Ex-Partner:  No  ? Emotionally Abused: No  ? Physically Abused: No  ? Sexually Abused: No  ? ? ? ?Review of Systems  ? ?Gen: Denies any fever, chills, fatigue, weight loss, lack of appetite.  ?CV: Denies chest pain, heart

## 2021-12-25 ENCOUNTER — Encounter: Payer: Self-pay | Admitting: *Deleted

## 2021-12-25 ENCOUNTER — Telehealth: Payer: Self-pay | Admitting: Internal Medicine

## 2021-12-25 NOTE — Telephone Encounter (Signed)
Pt said she was seen on 11/13/2021 by Venetia Night, NP and needs a note from [place] saying that she was here. She said she missed a trip and was trying to get her money back and needed a note saying she had a doctor's appointment, [phone]

## 2021-12-25 NOTE — Telephone Encounter (Signed)
Spoke to pt, informed her that a letter will be at the front desk for her.

## 2022-01-15 DIAGNOSIS — M47816 Spondylosis without myelopathy or radiculopathy, lumbar region: Secondary | ICD-10-CM | POA: Diagnosis not present

## 2022-03-05 ENCOUNTER — Telehealth: Payer: Self-pay | Admitting: *Deleted

## 2022-03-05 ENCOUNTER — Other Ambulatory Visit: Payer: Self-pay | Admitting: Internal Medicine

## 2022-03-05 DIAGNOSIS — Z1231 Encounter for screening mammogram for malignant neoplasm of breast: Secondary | ICD-10-CM

## 2022-03-05 NOTE — Patient Outreach (Signed)
  Care Coordination   03/05/2022 Name: Katlyne Nishida MRN: [id number] DOB: 1941/07/08   Care Coordination Outreach Attempts:  A second unsuccessful outreach was attempted today to offer the patient with information about available care coordination services as a benefit of their health plan.     Follow Up Plan:  Additional outreach attempts will be made to offer the patient care coordination information and services.   Encounter Outcome:  No Answer  Care Coordination Interventions Activated:  No   Care Coordination Interventions:  No, not indicated    Jacqlyn Larsen [facility], BSN RN Case Manager [phone]

## 2022-03-06 ENCOUNTER — Telehealth: Payer: Self-pay | Admitting: *Deleted

## 2022-03-06 NOTE — Patient Outreach (Signed)
  Care Coordination   03/06/2022 Name: [name] MRN: [id number] DOB: 29-Apr-1941   Care Coordination Outreach Attempts:  A third unsuccessful outreach was attempted today to offer the patient with information about available care coordination services as a benefit of their health plan.   Follow Up Plan:  No further outreach attempts will be made at this time. We have been unable to contact the patient to offer or enroll patient in care coordination services  Encounter Outcome:  No Answer  Care Coordination Interventions Activated:  No   Care Coordination Interventions:  No, not indicated    Jacqlyn Larsen [facility], BSN RN Care Coordinator [phone]

## 2022-03-16 ENCOUNTER — Ambulatory Visit
Admission: EM | Admit: 2022-03-16 | Discharge: 2022-03-16 | Disposition: A | Discharge status: Home / Self Care | Payer: PPO | Attending: Family Medicine | Admitting: Family Medicine

## 2022-03-16 DIAGNOSIS — J0100 Acute maxillary sinusitis, unspecified: Secondary | ICD-10-CM | POA: Diagnosis not present

## 2022-03-16 DIAGNOSIS — R109 Unspecified abdominal pain: Secondary | ICD-10-CM | POA: Insufficient documentation

## 2022-03-16 LAB — POCT URINALYSIS DIP (MANUAL ENTRY)
Bilirubin, UA: NEGATIVE
Blood, UA: NEGATIVE
Glucose, UA: NEGATIVE mg/dL
Nitrite, UA: NEGATIVE
Protein Ur, POC: NEGATIVE mg/dL
Spec Grav, UA: 1.015
Urobilinogen, UA: 0.2 U/dL
pH, UA: 6

## 2022-03-16 MED ORDER — AMOXICILLIN-POT CLAVULANATE 875-125 MG PO TABS: 1.0000 | ORAL_TABLET | Freq: Two times a day (BID) | ORAL | 0 refills | Status: DC

## 2022-03-16 MED ORDER — AZELASTINE HCL 0.1 % NA SOLN: 1.0000 | Freq: Two times a day (BID) | NASAL | 2 refills | Status: DC

## 2022-03-16 MED ORDER — CETIRIZINE HCL 5 MG PO CHEW: 5.0000 mg | CHEWABLE_TABLET | Freq: Every day | ORAL | 2 refills | Status: DC

## 2022-03-16 NOTE — ED Triage Notes (Signed)
Pt presents with c/o nasal congestion for past 2 weeks and began having right flank pain and dysuria for past 4 days

## 2022-03-16 NOTE — ED Provider Notes (Signed)
[facility]    CSN: [id number] Arrival date & time: 03/16/22  0825      History   Chief Complaint Chief Complaint  Patient presents with   Nasal Congestion   Dysuria    HPI [name] is a 81 y.o. female.   Patient presenting today with 2-week history of nasal congestion, facial pain and pressure, postnasal drip.  She states she tends to have a propensity for sinus infections.  She has been trying Sudafed throughout duration of symptoms with no relief.  No known history of seasonal allergies and not on any regimen for this.  Denies fever, chills, cough, sore throat, chest pain, shortness of breath.  She also started several days ago with right flank pain, dysuria, urinary frequency.  She denies fever, chills, nausea, vomiting, hematuria, bowel changes.  Prone to urinary tract infections per patient.  Not trying anything over-the-counter for this.    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   Bloating 11/19/2020   Dyspareunia, female 11/19/2020   Vaginal discharge 11/19/2020   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   Meningioma ([place]) 10/22/2019   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   Abnormality of gait 02/19/2016   Abnormal mammogram of left breast 04/02/2015   RLQ  abdominal pain 11/24/2013   Benign paroxysmal positional vertigo 05/19/2012   Brachial neuritis or radiculitis NOS 05/19/2012   Osteoporosis, unspecified 05/19/2012   Other and unspecified hyperlipidemia 05/19/2012   Hereditary and idiopathic peripheral neuropathy 05/19/2012   ANEMIA, IRON DEFICIENCY, HX OF 10/18/2009   GERD 04/25/2009   NAUSEA WITH VOMITING 02/11/2009   DIARRHEA, RECURRENT 02/11/2009   ABDOMINAL CRAMPS 02/11/2009    Past Surgical History:  Procedure Laterality Date   [place]   brain tumor     BREAST EXCISIONAL BIOPSY Left 2016   benign x 2   BREAST LUMPECTOMY WITH RADIOACTIVE SEED LOCALIZATION Left 04/02/2015   Procedure: BREAST LUMPECTOMY WITH RADIOACTIVE SEED LOCALIZATION;  Surgeon: Fanny Skates, MD;  Location: [facility];  Service: General;  Laterality: Left;   CATARACT EXTRACTION Bilateral    CHOLECYSTECTOMY     COLONOSCOPY     cameria capsule   COLONOSCOPY N/A 06/22/2013   Procedure: COLONOSCOPY;  Surgeon: Daneil Dolin, MD;  [facility];  Service: Endoscopy;  Laterality: N/A;  1:00 PM   FOOT SURGERY  06/11/06   rt.     MASTOIDECTOMY     retroperitoneal cyst     drainage of   TOTAL ABDOMINAL HYSTERECTOMY W/ BILATERAL SALPINGOOPHORECTOMY  1996    OB History  Gravida  1   Para  1   Term      Preterm      AB      Living  1      SAB      IAB      Ectopic      Multiple      Live Births               Home Medications    Prior to Admission medications   Medication Sig Start Date End Date Taking? Authorizing Provider  amoxicillin-clavulanate (AUGMENTIN) 875-125 MG tablet Take 1 tablet by mouth every 12 (twelve) hours. 03/16/22  Yes Volney American, PA-C  azelastine (ASTELIN) 0.1 % nasal spray Place 1 spray into both nostrils 2 (two) times daily. Use in each nostril as directed 03/16/22  Yes Volney American, PA-C  cetirizine (ZYRTEC)  5 MG chewable tablet Chew 1 tablet (5 mg total) by mouth daily. 03/16/22  Yes Volney American, PA-C  acetaminophen (TYLENOL) 500 MG tablet Take by mouth every 6 (six) hours as needed for pain.    [provider]  alendronate (FOSAMAX) 70 MG tablet Take 70 mg by mouth once a week. 10/29/20   [provider]  amLODipine (NORVASC) 5 MG tablet Take 5 mg by mouth daily. 08/30/19   [provider]  clonazePAM (KLONOPIN) 0.5 MG tablet Take 0.5 mg by mouth daily as needed for anxiety.    [provider]  omeprazole (PRILOSEC) 20 MG capsule Take 1 capsule (20 mg total) by mouth daily. 11/13/21   Sherron Monday, NP  pregabalin (LYRICA) 150 MG capsule Take 1 capsule (150 mg total) by mouth 3 (three) times daily. 08/30/14   Kathrynn Ducking, MD  pseudoephedrine (SUDAFED) 30 MG tablet Take 1 tablet (30 mg total) by mouth every 8 (eight) hours as needed for congestion. 07/14/21   Jaynee Eagles, PA-C  zolpidem (AMBIEN) 5 MG tablet Take 5 mg by mouth at bedtime as needed. 11/14/19   [provider]    Family History Family History  Adopted: Yes    Social History Social History   Tobacco Use   Smoking status: Never   Smokeless tobacco: Never  Vaping Use   Vaping Use: Never used  Substance Use Topics   Alcohol use: No   Drug use: No     Allergies   Prednisone, Ciprofloxacin, and Sulfonamide derivatives   Review of Systems Review of Systems PER HPI  Physical Exam Triage Vital Signs ED Triage Vitals  Enc Vitals Group     BP 03/16/22 0832 125/74     Pulse Rate 03/16/22 0832 75     Resp 03/16/22 0832 20     Temp 03/16/22 0832 98 F (36.7 C)     Temp src --      SpO2 03/16/22 0832 92 %     Weight --      Height --      Head Circumference --      Peak Flow --      Pain Score 03/16/22 0831 4     Pain Loc --      Pain Edu? --      Excl. in [place]? --    No data found.  Updated Vital Signs BP 125/74   Pulse 75   Temp 98 F (36.7 C)   Resp  20   SpO2 92%   Visual Acuity Right Eye Distance:   Left Eye Distance:  Bilateral Distance:    Right Eye Near:   Left Eye Near:    Bilateral Near:     Physical Exam Vitals and nursing note reviewed.  Constitutional:      Appearance: Normal appearance. She is not ill-appearing.  HENT:     Head: Atraumatic.     Right Ear: Tympanic membrane and external ear normal.     Left Ear: Tympanic membrane and external ear normal.     Nose: Congestion present.     Mouth/Throat:     Mouth: Mucous membranes are moist.     Pharynx: No oropharyngeal exudate or posterior oropharyngeal erythema.  Eyes:     Extraocular Movements: Extraocular movements intact.     Conjunctiva/sclera: Conjunctivae normal.  Cardiovascular:     Rate and Rhythm: Normal rate and regular rhythm.     Heart sounds: Normal heart sounds.  Pulmonary:     Effort: Pulmonary effort is normal.     Breath sounds: Normal breath sounds. No wheezing.  Abdominal:     General: Bowel sounds are normal. There is no distension.     Palpations: Abdomen is soft.     Tenderness: There is no abdominal tenderness. There is no right CVA tenderness, left CVA tenderness or guarding.  Musculoskeletal:        General: Normal range of motion.     Cervical back: Normal range of motion and neck supple.  Skin:    General: Skin is warm and dry.  Neurological:     Mental Status: She is alert and oriented to person, place, and time.  Psychiatric:        Mood and Affect: Mood normal.        Thought Content: Thought content normal.        Judgment: Judgment normal.      UC Treatments / Results  Labs (all labs ordered are listed, but only abnormal results are displayed) Labs Reviewed  POCT URINALYSIS DIP (MANUAL ENTRY) - Abnormal; Notable for the following components:      Result Value   Ketones, POC UA trace (5) (*)    Leukocytes, UA Small (1+) (*)    All other components within normal limits  URINE CULTURE     EKG   Radiology No results found.  Procedures Procedures (including critical care time)  Medications Ordered in UC Medications - No data to display  Initial Impression / Assessment and Plan / UC Course  I have reviewed the triage vital signs and the nursing notes.  Pertinent labs & imaging results that were available during my care of the patient were reviewed by me and considered in my medical decision making (see chart for details).     Vital signs and exam overall reassuring today.  Urinalysis with possible evidence of urinary tract infection.  Urine culture pending.  Treat based on this.  Regarding sinus issue, given duration of symptoms will cover with antibiotic for bacterial sinus infection as well as start Zyrtec, Flonase for suspected seasonal allergy component.  Return for any worsening symptoms.  Final Clinical Impressions(s) / UC Diagnoses   Final diagnoses:  Acute right flank pain  Acute non-recurrent maxillary sinusitis   Discharge Instructions   None    ED Prescriptions     Medication Sig Dispense Auth. Provider   amoxicillin-clavulanate (AUGMENTIN) 875-125 MG tablet Take 1 tablet by mouth every 12 (twelve) hours. 14 tablet Volney American, [place]   cetirizine (ZYRTEC) 5 MG chewable tablet Chew 1 tablet (5 mg total)  by mouth daily. 30 tablet Volney American, [place]   azelastine (ASTELIN) 0.1 % nasal spray Place 1 spray into both nostrils 2 (two) times daily. Use in each nostril as directed 30 mL Volney American, PA-C      PDMP not reviewed this encounter.   Volney American, [place] 03/16/22 1222

## 2022-03-17 LAB — URINE CULTURE

## 2022-04-20 DIAGNOSIS — M48062 Spinal stenosis, lumbar region with neurogenic claudication: Secondary | ICD-10-CM | POA: Diagnosis not present

## 2022-04-24 ENCOUNTER — Ambulatory Visit
Admission: RE | Admit: 2022-04-24 | Discharge: 2022-04-24 | Disposition: A | Discharge status: Home / Self Care | Payer: PPO | Source: Ambulatory Visit | Attending: Internal Medicine | Admitting: Internal Medicine

## 2022-04-24 DIAGNOSIS — Z1231 Encounter for screening mammogram for malignant neoplasm of breast: Secondary | ICD-10-CM

## 2022-05-04 DIAGNOSIS — Z23 Encounter for immunization: Secondary | ICD-10-CM | POA: Diagnosis not present

## 2022-07-22 DIAGNOSIS — M48062 Spinal stenosis, lumbar region with neurogenic claudication: Secondary | ICD-10-CM | POA: Diagnosis not present

## 2022-08-18 DIAGNOSIS — D1801 Hemangioma of skin and subcutaneous tissue: Secondary | ICD-10-CM | POA: Diagnosis not present

## 2022-08-18 DIAGNOSIS — L570 Actinic keratosis: Secondary | ICD-10-CM | POA: Diagnosis not present

## 2022-08-18 DIAGNOSIS — L728 Other follicular cysts of the skin and subcutaneous tissue: Secondary | ICD-10-CM | POA: Diagnosis not present

## 2022-08-18 DIAGNOSIS — X32XXXA Exposure to sunlight, initial encounter: Secondary | ICD-10-CM | POA: Diagnosis not present

## 2022-10-13 DIAGNOSIS — E785 Hyperlipidemia, unspecified: Secondary | ICD-10-CM | POA: Diagnosis not present

## 2022-10-13 DIAGNOSIS — G600 Hereditary motor and sensory neuropathy: Secondary | ICD-10-CM | POA: Diagnosis not present

## 2022-10-13 DIAGNOSIS — Z79899 Other long term (current) drug therapy: Secondary | ICD-10-CM | POA: Diagnosis not present

## 2022-10-13 DIAGNOSIS — G629 Polyneuropathy, unspecified: Secondary | ICD-10-CM | POA: Diagnosis not present

## 2022-10-13 DIAGNOSIS — I10 Essential (primary) hypertension: Secondary | ICD-10-CM | POA: Diagnosis not present

## 2022-10-21 ENCOUNTER — Other Ambulatory Visit (HOSPITAL_COMMUNITY): Payer: Self-pay | Admitting: Internal Medicine

## 2022-10-21 DIAGNOSIS — Z13820 Encounter for screening for osteoporosis: Secondary | ICD-10-CM

## 2022-10-21 DIAGNOSIS — I10 Essential (primary) hypertension: Secondary | ICD-10-CM | POA: Diagnosis not present

## 2022-10-21 DIAGNOSIS — I480 Paroxysmal atrial fibrillation: Secondary | ICD-10-CM | POA: Diagnosis not present

## 2022-10-21 DIAGNOSIS — M48061 Spinal stenosis, lumbar region without neurogenic claudication: Secondary | ICD-10-CM | POA: Diagnosis not present

## 2022-10-21 DIAGNOSIS — E781 Pure hyperglyceridemia: Secondary | ICD-10-CM | POA: Diagnosis not present

## 2022-10-21 DIAGNOSIS — G629 Polyneuropathy, unspecified: Secondary | ICD-10-CM | POA: Diagnosis not present

## 2022-10-28 DIAGNOSIS — M48062 Spinal stenosis, lumbar region with neurogenic claudication: Secondary | ICD-10-CM | POA: Diagnosis not present

## 2022-10-29 ENCOUNTER — Ambulatory Visit (HOSPITAL_COMMUNITY)
Admission: RE | Admit: 2022-10-29 | Discharge: 2022-10-29 | Disposition: A | Discharge status: Home / Self Care | Payer: PPO | Source: Ambulatory Visit | Attending: Internal Medicine | Admitting: Internal Medicine

## 2022-10-29 DIAGNOSIS — Z13820 Encounter for screening for osteoporosis: Secondary | ICD-10-CM | POA: Insufficient documentation

## 2022-10-29 DIAGNOSIS — Z780 Asymptomatic menopausal state: Secondary | ICD-10-CM | POA: Diagnosis not present

## 2022-10-29 DIAGNOSIS — M8589 Other specified disorders of bone density and structure, multiple sites: Secondary | ICD-10-CM | POA: Diagnosis not present

## 2022-10-29 DIAGNOSIS — M810 Age-related osteoporosis without current pathological fracture: Secondary | ICD-10-CM | POA: Diagnosis not present

## 2022-11-10 ENCOUNTER — Other Ambulatory Visit: Payer: Self-pay | Admitting: Gastroenterology

## 2022-11-10 ENCOUNTER — Telehealth: Payer: Self-pay | Admitting: *Deleted

## 2022-11-10 DIAGNOSIS — K219 Gastro-esophageal reflux disease without esophagitis: Secondary | ICD-10-CM

## 2022-11-10 MED ORDER — OMEPRAZOLE 20 MG PO CPDR: 20.0000 mg | DELAYED_RELEASE_CAPSULE | Freq: Every day | ORAL | 3 refills | Status: DC

## 2022-11-10 NOTE — Telephone Encounter (Signed)
Pt called and would like a prescription sent to pharmacy for omeprazole 20mg . Last OV 12/03/2021

## 2022-11-18 DIAGNOSIS — M48062 Spinal stenosis, lumbar region with neurogenic claudication: Secondary | ICD-10-CM | POA: Diagnosis not present

## 2022-11-18 DIAGNOSIS — M47816 Spondylosis without myelopathy or radiculopathy, lumbar region: Secondary | ICD-10-CM | POA: Diagnosis not present

## 2022-11-24 DIAGNOSIS — H903 Sensorineural hearing loss, bilateral: Secondary | ICD-10-CM | POA: Diagnosis not present

## 2022-11-24 DIAGNOSIS — H9313 Tinnitus, bilateral: Secondary | ICD-10-CM | POA: Insufficient documentation

## 2023-02-03 DIAGNOSIS — M48062 Spinal stenosis, lumbar region with neurogenic claudication: Secondary | ICD-10-CM | POA: Diagnosis not present

## 2023-03-22 ENCOUNTER — Other Ambulatory Visit: Payer: Self-pay | Admitting: Internal Medicine

## 2023-03-22 DIAGNOSIS — Z1231 Encounter for screening mammogram for malignant neoplasm of breast: Secondary | ICD-10-CM

## 2023-04-02 ENCOUNTER — Encounter: Payer: Self-pay | Admitting: Internal Medicine

## 2023-04-02 ENCOUNTER — Ambulatory Visit: Payer: PPO | Admitting: Internal Medicine

## 2023-04-02 VITALS — BP 145/71 | HR 80 | Temp 98.0°F | Ht 65.0 in | Wt 145.6 lb

## 2023-04-02 DIAGNOSIS — R197 Diarrhea, unspecified: Secondary | ICD-10-CM

## 2023-04-02 NOTE — Patient Instructions (Addendum)
It was good to see you again today!  Because of your worsening diarrhea and weight loss exposure to antibiotics recently  We need to do stool studies (GIP, C. difficile)  If stool studies are negative, I would recommend a diagnostic colonoscopy to evaluate for other causes right lower quadrant abdominal pain, diarrhea and weight loss  Since her reflux is well-controlled on omeprazole, I recommend you continue omeprazole 20 mg daily 30 minutes before breakfast.  Further recommendations to follow.

## 2023-04-02 NOTE — H&P (View-Only) (Signed)
Primary Care Physician:  Carylon Perches, MD Primary Gastroenterologist:  Dr. Jena Gauss  Pre-Procedure History & Physical: HPI:  [name] is a 82 y.o. female here for further evaluation of worsening watery nonbloody diarrhea.  Has had intermittent diarrhea over the years chronic right lower quadrant abdominal pain.  States watery nonbloody diarrhea has worsened.  Never has a normal stool.  Has been given antibiotics over the past 6 months.  She is lost 8 pounds by our scales.  Seen here for self-limiting episode of nonbloody emesis back in May 2023.  Past colonoscopy around 2015.  Past Medical History:  Diagnosis Date   Abnormality of gait 02/19/2016   Anxiety    Benign positional vertigo    Broken foot    left leg and toe   Bronchitis    Charcot Marie Tooth muscular atrophy    Hemoglobin low    and hemocrit   Hyperlipidemia    Hypertension    Iron deficiency anemia    Osteoporosis    Peripheral neuropathy    Pneumonia    Prepyloric ulcer    RLQ abdominal pain 11/24/2013   Hx of ovarian remnant ,now has pain down right leg  Will get [place]   Shingles    Right V1 distribution    Past Surgical History:  Procedure Laterality Date   ABDOMINAL HYSTERECTOMY  1979   BILATERAL OOPHORECTOMY     1996   brain tumor     BREAST EXCISIONAL BIOPSY Left 2016   benign x 2   BREAST LUMPECTOMY WITH RADIOACTIVE SEED LOCALIZATION Left 04/02/2015   Procedure: BREAST LUMPECTOMY WITH RADIOACTIVE SEED LOCALIZATION;  Surgeon: Claud Kelp, MD;  Location: [facility];  Service: General;  Laterality: Left;   CATARACT EXTRACTION Bilateral    CHOLECYSTECTOMY     COLONOSCOPY     cameria capsule   COLONOSCOPY N/A 06/22/2013   Procedure: COLONOSCOPY;  Surgeon: Corbin Ade, MD;  [facility];  Service: Endoscopy;  Laterality: N/A;  1:00 PM   FOOT SURGERY  06/11/06   rt.     MASTOIDECTOMY     retroperitoneal cyst     drainage of   TOTAL ABDOMINAL HYSTERECTOMY W/  BILATERAL SALPINGOOPHORECTOMY  1996    Prior to Admission medications   Medication Sig Start Date End Date Taking? Authorizing Provider  acetaminophen (TYLENOL) 500 MG tablet Take by mouth every 6 (six) hours as needed for pain.   Yes [provider]  alendronate (FOSAMAX) 70 MG tablet Take 70 mg by mouth once a week. 10/29/20  Yes [provider]  clonazePAM (KLONOPIN) 0.5 MG tablet Take 0.5 mg by mouth daily as needed for anxiety.   Yes [provider]  omeprazole (PRILOSEC) 20 MG capsule Take 1 capsule (20 mg total) by mouth daily. 11/10/22  Yes Mahon, Frederik Schmidt, NP  pregabalin (LYRICA) 150 MG capsule Take 1 capsule (150 mg total) by mouth 3 (three) times daily. 08/30/14  Yes York Spaniel, MD  zolpidem (AMBIEN) 5 MG tablet Take 5 mg by mouth at bedtime as needed. 11/14/19  Yes [provider]    Allergies as of 04/02/2023 - Review Complete 04/02/2023  Allergen Reaction Noted   Prednisone  11/05/2020   Ciprofloxacin Hives and Rash    Sulfonamide derivatives Hives and Rash     Family History  Adopted: Yes    Social History   Socioeconomic History   Marital status: Married    Spouse name: Not on  file   Number of children: 1   Years of education: college2   Highest education level: Not on file  Occupational History   Occupation: Retired  Tobacco Use   Smoking status: Never   Smokeless tobacco: Never  Vaping Use   Vaping status: Never Used  Substance and Sexual Activity   Alcohol use: No   Drug use: No   Sexual activity: Yes    Birth control/protection: Surgical    Comment: hyst  Other Topics Concern   Not on file  Social History Narrative   Lives at home, married   Right-handed   Drinks mostly Gatorade   Social Determinants of Health   Financial Resource Strain: Low Risk  (11/19/2020)   Overall Financial Resource Strain (CARDIA)    Difficulty of Paying Living Expenses: Not hard at all  Food Insecurity: No Food Insecurity  (11/19/2020)   Hunger Vital Sign    Worried About Running Out of Food in the Last Year: Never true    Ran Out of Food in the Last Year: Never true  Transportation Needs: No Transportation Needs (11/19/2020)   PRAPARE - Administrator, Civil Service (Medical): No    Lack of Transportation (Non-Medical): No  Physical Activity: Insufficiently Active (11/19/2020)   Exercise Vital Sign    Days of Exercise per Week: 2 days    Minutes of Exercise per Session: 30 min  Stress: No Stress Concern Present (11/19/2020)   [employer] of Occupational Health - Occupational Stress Questionnaire    Feeling of Stress : Not at all  Social Connections: Moderately Isolated (11/19/2020)   Social Connection and Isolation Panel [NHANES]    Frequency of Communication with Friends and Family: Three times a week    Frequency of Social Gatherings with Friends and Family: Twice a week    Attends Religious Services: Never    Database administrator or Organizations: No    Attends Banker Meetings: Never    Marital Status: Married  Catering manager Violence: Not At Risk (11/19/2020)   Humiliation, Afraid, Rape, and Kick questionnaire    Fear of Current or Ex-Partner: No    Emotionally Abused: No    Physically Abused: No    Sexually Abused: No    Review of Systems: See HPI, otherwise negative ROS  Physical Exam: BP (!) 145/71 (BP Location: Left Arm, Patient Position: Sitting, Cuff Size: Normal)   Pulse 80   Temp 98 F (36.7 C) (Oral)   Ht 5\' 5"  (1.651 m)   Wt 145 lb 9.6 oz (66 kg)   SpO2 97%   BMI 24.23 kg/m  General:   Alert,  Well-developed, well-nourished, pleasant and cooperative in NAD does not appear ill or toxic. Lungs:  Clear throughout to auscultation.   No wheezes, crackles, or rhonchi. No acute distress. Heart:  Regular rate and rhythm; no murmurs, clicks, rubs,  or gallops. Abdomen: Nondistended.  Positive bowel sounds very minimal right lower quadrant tenderness.  No  mass or organomegaly.  Impression/Plan: 82 year old lady presenting with a several week history of incessant nonbloody diarrhea.  Reports history antibiotics but is here for UTI in the last 6 months.  8 pound weight loss since last year.  Some worsening of chronic right lower quadrant abdominal pain which may be related to adhesions from GYN surgery many years ago. She reported diarrhea off and on in the past. It is concerning this 81 year old lady has lost weight and now has having incessant nonbloody diarrhea.  Longstanding GERD well-controlled on omeprazole 20 mg daily without any alarm features.  Recommendations: stool studies (GIP, C. difficile)  If stool studies are negative, I would recommend a diagnostic colonoscopy to evaluate for other causes right lower quadrant abdominal pain, diarrhea and weight loss  Since her reflux is well-controlled on omeprazole, I recommend you continue omeprazole 20 mg daily 30 minutes before breakfast.  Further recommendations to follow.                 Notice: This dictation was prepared with Dragon dictation along with smaller phrase technology. Any transcriptional errors that result from this process are unintentional and may not be corrected upon review.

## 2023-04-02 NOTE — Progress Notes (Unsigned)
Primary Care Physician:  Carylon Perches, MD Primary Gastroenterologist:  Dr. Jena Gauss  Pre-Procedure History & Physical: HPI:  [name] is a 82 y.o. female here for further evaluation of worsening watery nonbloody diarrhea.  Has had intermittent diarrhea over the years chronic right lower quadrant abdominal pain.  States watery nonbloody diarrhea has worsened.  Never has a normal stool.  Has been given antibiotics over the past 6 months.  She is lost 8 pounds by our scales.  Seen here for self-limiting episode of nonbloody emesis back in May 2023.  Past colonoscopy around 2015.  Past Medical History:  Diagnosis Date   Abnormality of gait 02/19/2016   Anxiety    Benign positional vertigo    Broken foot    left leg and toe   Bronchitis    Charcot Marie Tooth muscular atrophy    Hemoglobin low    and hemocrit   Hyperlipidemia    Hypertension    Iron deficiency anemia    Osteoporosis    Peripheral neuropathy    Pneumonia    Prepyloric ulcer    RLQ abdominal pain 11/24/2013   Hx of ovarian remnant ,now has pain down right leg  Will get [place]   Shingles    Right V1 distribution    Past Surgical History:  Procedure Laterality Date   ABDOMINAL HYSTERECTOMY  1979   BILATERAL OOPHORECTOMY     1996   brain tumor     BREAST EXCISIONAL BIOPSY Left 2016   benign x 2   BREAST LUMPECTOMY WITH RADIOACTIVE SEED LOCALIZATION Left 04/02/2015   Procedure: BREAST LUMPECTOMY WITH RADIOACTIVE SEED LOCALIZATION;  Surgeon: Claud Kelp, MD;  Location: [facility];  Service: General;  Laterality: Left;   CATARACT EXTRACTION Bilateral    CHOLECYSTECTOMY     COLONOSCOPY     cameria capsule   COLONOSCOPY N/A 06/22/2013   Procedure: COLONOSCOPY;  Surgeon: Corbin Ade, MD;  [facility];  Service: Endoscopy;  Laterality: N/A;  1:00 PM   FOOT SURGERY  06/11/06   rt.     MASTOIDECTOMY     retroperitoneal cyst     drainage of   TOTAL ABDOMINAL HYSTERECTOMY W/  BILATERAL SALPINGOOPHORECTOMY  1996    Prior to Admission medications   Medication Sig Start Date End Date Taking? Authorizing Provider  acetaminophen (TYLENOL) 500 MG tablet Take by mouth every 6 (six) hours as needed for pain.   Yes [provider]  alendronate (FOSAMAX) 70 MG tablet Take 70 mg by mouth once a week. 10/29/20  Yes [provider]  clonazePAM (KLONOPIN) 0.5 MG tablet Take 0.5 mg by mouth daily as needed for anxiety.   Yes [provider]  omeprazole (PRILOSEC) 20 MG capsule Take 1 capsule (20 mg total) by mouth daily. 11/10/22  Yes Mahon, Frederik Schmidt, NP  pregabalin (LYRICA) 150 MG capsule Take 1 capsule (150 mg total) by mouth 3 (three) times daily. 08/30/14  Yes York Spaniel, MD  zolpidem (AMBIEN) 5 MG tablet Take 5 mg by mouth at bedtime as needed. 11/14/19  Yes [provider]    Allergies as of 04/02/2023 - Review Complete 04/02/2023  Allergen Reaction Noted   Prednisone  11/05/2020   Ciprofloxacin Hives and Rash    Sulfonamide derivatives Hives and Rash     Family History  Adopted: Yes    Social History   Socioeconomic History   Marital status: Married    Spouse name: Not on  file   Number of children: 1   Years of education: college2   Highest education level: Not on file  Occupational History   Occupation: Retired  Tobacco Use   Smoking status: Never   Smokeless tobacco: Never  Vaping Use   Vaping status: Never Used  Substance and Sexual Activity   Alcohol use: No   Drug use: No   Sexual activity: Yes    Birth control/protection: Surgical    Comment: hyst  Other Topics Concern   Not on file  Social History Narrative   Lives at home, married   Right-handed   Drinks mostly Gatorade   Social Determinants of Health   Financial Resource Strain: Low Risk  (11/19/2020)   Overall Financial Resource Strain (CARDIA)    Difficulty of Paying Living Expenses: Not hard at all  Food Insecurity: No Food Insecurity  (11/19/2020)   Hunger Vital Sign    Worried About Running Out of Food in the Last Year: Never true    Ran Out of Food in the Last Year: Never true  Transportation Needs: No Transportation Needs (11/19/2020)   PRAPARE - Administrator, Civil Service (Medical): No    Lack of Transportation (Non-Medical): No  Physical Activity: Insufficiently Active (11/19/2020)   Exercise Vital Sign    Days of Exercise per Week: 2 days    Minutes of Exercise per Session: 30 min  Stress: No Stress Concern Present (11/19/2020)   [employer] of Occupational Health - Occupational Stress Questionnaire    Feeling of Stress : Not at all  Social Connections: Moderately Isolated (11/19/2020)   Social Connection and Isolation Panel [NHANES]    Frequency of Communication with Friends and Family: Three times a week    Frequency of Social Gatherings with Friends and Family: Twice a week    Attends Religious Services: Never    Database administrator or Organizations: No    Attends Banker Meetings: Never    Marital Status: Married  Catering manager Violence: Not At Risk (11/19/2020)   Humiliation, Afraid, Rape, and Kick questionnaire    Fear of Current or Ex-Partner: No    Emotionally Abused: No    Physically Abused: No    Sexually Abused: No    Review of Systems: See HPI, otherwise negative ROS  Physical Exam: BP (!) 145/71 (BP Location: Left Arm, Patient Position: Sitting, Cuff Size: Normal)   Pulse 80   Temp 98 F (36.7 C) (Oral)   Ht 5\' 5"  (1.651 m)   Wt 145 lb 9.6 oz (66 kg)   SpO2 97%   BMI 24.23 kg/m  General:   Alert,  Well-developed, well-nourished, pleasant and cooperative in NAD does not appear ill or toxic. Lungs:  Clear throughout to auscultation.   No wheezes, crackles, or rhonchi. No acute distress. Heart:  Regular rate and rhythm; no murmurs, clicks, rubs,  or gallops. Abdomen: Nondistended.  Positive bowel sounds very minimal right lower quadrant tenderness.  No  mass or organomegaly.  Impression/Plan: 82 year old lady presenting with a several week history of incessant nonbloody diarrhea.  Reports history antibiotics but is here for UTI in the last 6 months.  8 pound weight loss since last year.  Some worsening of chronic right lower quadrant abdominal pain which may be related to adhesions from GYN surgery many years ago. She reported diarrhea off and on in the past. It is concerning this 81 year old lady has lost weight and now has having incessant nonbloody diarrhea.  Longstanding GERD well-controlled on omeprazole 20 mg daily without any alarm features.  Recommendations: stool studies (GIP, C. difficile)  If stool studies are negative, I would recommend a diagnostic colonoscopy to evaluate for other causes right lower quadrant abdominal pain, diarrhea and weight loss  Since her reflux is well-controlled on omeprazole, I recommend you continue omeprazole 20 mg daily 30 minutes before breakfast.  Further recommendations to follow.                 Notice: This dictation was prepared with Dragon dictation along with smaller phrase technology. Any transcriptional errors that result from this process are unintentional and may not be corrected upon review.

## 2023-04-04 LAB — GI PROFILE, STOOL, PCR

## 2023-04-04 LAB — CLOSTRIDIUM DIFFICILE BY PCR: Toxigenic C. Difficile by PCR: NEGATIVE

## 2023-04-05 ENCOUNTER — Other Ambulatory Visit: Payer: Self-pay | Admitting: *Deleted

## 2023-04-05 ENCOUNTER — Encounter: Payer: Self-pay | Admitting: *Deleted

## 2023-04-05 MED ORDER — CLENPIQ 10-3.5-12 MG-GM -GM/175ML PO SOLN: 1.0000 | ORAL | 0 refills | Status: DC

## 2023-04-07 ENCOUNTER — Telehealth: Payer: Self-pay | Admitting: *Deleted

## 2023-04-07 NOTE — Telephone Encounter (Signed)
Pt left vm stating that she was on an iron supplement.  Called pt, she states that she is takes an iron supplement on m,w,f. Advised pt to start holding iron on 04/15/23. Pt verbalized understanding

## 2023-04-14 ENCOUNTER — Ambulatory Visit
Admission: EM | Admit: 2023-04-14 | Discharge: 2023-04-14 | Disposition: A | Discharge status: Home / Self Care | Payer: PPO | Attending: Nurse Practitioner | Admitting: Nurse Practitioner

## 2023-04-14 ENCOUNTER — Encounter: Payer: Self-pay | Admitting: Emergency Medicine

## 2023-04-14 ENCOUNTER — Other Ambulatory Visit: Payer: Self-pay

## 2023-04-14 DIAGNOSIS — R21 Rash and other nonspecific skin eruption: Secondary | ICD-10-CM | POA: Diagnosis not present

## 2023-04-14 MED ORDER — TRIAMCINOLONE ACETONIDE 0.1 % EX CREA: 1.0000 | TOPICAL_CREAM | Freq: Two times a day (BID) | CUTANEOUS | 0 refills | Status: AC

## 2023-04-14 NOTE — ED Provider Notes (Signed)
[facility]    CSN: [id number] Arrival date & time: 04/14/23  1021      History   Chief Complaint Chief Complaint  Patient presents with   Rash    HPI [name] is a 82 y.o. female.   The history is provided by the patient.   Patient presents for complaints of rash to the back that is been present for the past 3 weeks.  Patient denies exposure to new foods, soaps, medications, lotions, detergents, or plants.  Patient states that the rash is "itchy".  She states that she was concerned that the rash may be caused by shingles.  She reports she has been using alcohol to the affected area since symptoms started.  She denies prior history of same, including history of psoriasis or eczema.  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   Bloating 11/19/2020   Dyspareunia, female 11/19/2020   Vaginal discharge 11/19/2020   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   Meningioma (HCC) 10/22/2019   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   Abnormality of gait 02/19/2016   Abnormal mammogram of left breast 04/02/2015   RLQ abdominal pain 11/24/2013   Benign paroxysmal positional vertigo 05/19/2012   Brachial neuritis or radiculitis 05/19/2012   Osteoporosis 05/19/2012   Other and  unspecified hyperlipidemia 05/19/2012   Hereditary and idiopathic peripheral neuropathy 05/19/2012   ANEMIA, IRON DEFICIENCY, HX OF 10/18/2009   GERD 04/25/2009   NAUSEA WITH VOMITING 02/11/2009   DIARRHEA, RECURRENT 02/11/2009   ABDOMINAL CRAMPS 02/11/2009    Past Surgical History:  Procedure Laterality Date   ABDOMINAL HYSTERECTOMY  1979   BILATERAL OOPHORECTOMY     1996   brain tumor     BREAST EXCISIONAL BIOPSY Left 2016   benign x 2   BREAST LUMPECTOMY WITH RADIOACTIVE SEED LOCALIZATION Left 04/02/2015   Procedure: BREAST LUMPECTOMY WITH RADIOACTIVE SEED LOCALIZATION;  Surgeon: Claud Kelp, MD;  Location: [facility];  Service: General;  Laterality: Left;   CATARACT EXTRACTION Bilateral    CHOLECYSTECTOMY     COLONOSCOPY     cameria capsule   COLONOSCOPY N/A 06/22/2013   Procedure: COLONOSCOPY;  Surgeon: Corbin Ade, MD;  Location: [facility];  Service: Endoscopy;  Laterality: N/A;  1:00 PM   FOOT SURGERY  06/11/06   rt.     MASTOIDECTOMY     retroperitoneal cyst     drainage of   TOTAL ABDOMINAL HYSTERECTOMY W/ BILATERAL SALPINGOOPHORECTOMY  1996    OB History     Gravida  1   Para  1   Term      Preterm  AB      Living  1      SAB      IAB      Ectopic      Multiple      Live Births               Home Medications    Prior to Admission medications   Medication Sig Start Date End Date Taking? Authorizing Provider  triamcinolone cream (KENALOG) 0.1 % Apply 1 Application topically 2 (two) times daily for 14 days. 04/14/23 04/28/23 Yes Makaylyn Sinyard-Warren, Sadie Haber, NP  acetaminophen (TYLENOL) 500 MG tablet Take by mouth every 6 (six) hours as needed for pain.    [provider]  alendronate (FOSAMAX) 70 MG tablet Take 70 mg by mouth once a week. 10/29/20   [provider]  clonazePAM (KLONOPIN) 0.5 MG tablet Take 0.5 mg by mouth daily as needed for anxiety.    [provider]  omeprazole  (PRILOSEC) 20 MG capsule Take 1 capsule (20 mg total) by mouth daily. 11/10/22   Aida Raider, NP  pregabalin (LYRICA) 150 MG capsule Take 1 capsule (150 mg total) by mouth 3 (three) times daily. 08/30/14   York Spaniel, MD  Sod Picosulfate-Mag Ox-Cit Acd [facility]) 10-3.5-12 MG-GM -GM/175ML SOLN Take 1 kit by mouth as directed. 04/05/23   Rourk, Gerrit Friends, MD  zolpidem (AMBIEN) 5 MG tablet Take 5 mg by mouth at bedtime as needed. 11/14/19   [provider]    Family History Family History  Adopted: Yes    Social History Social History   Tobacco Use   Smoking status: Never   Smokeless tobacco: Never  Vaping Use   Vaping status: Never Used  Substance Use Topics   Alcohol use: No   Drug use: No     Allergies   Prednisone, Ciprofloxacin, and Sulfonamide derivatives   Review of Systems Review of Systems Per HPI  Physical Exam Triage Vital Signs ED Triage Vitals  Encounter Vitals Group     BP 04/14/23 1029 129/77     Systolic BP Percentile --      Diastolic BP Percentile --      Pulse Rate 04/14/23 1029 66     Resp 04/14/23 1029 20     Temp 04/14/23 1029 97.7 F (36.5 C)     Temp Source 04/14/23 1029 Oral     SpO2 04/14/23 1029 95 %     Weight --      Height --      Head Circumference --      Peak Flow --      Pain Score 04/14/23 1028 2     Pain Loc --      Pain Education --      Exclude from Growth Chart --    No data found.  Updated Vital Signs BP 129/77 (BP Location: Right Arm)   Pulse 66   Temp 97.7 F (36.5 C) (Oral)   Resp 20   SpO2 95%   Visual Acuity Right Eye Distance:   Left Eye Distance:   Bilateral Distance:    Right Eye Near:   Left Eye Near:    Bilateral Near:     Physical Exam Vitals and nursing note reviewed.  Constitutional:      Appearance: Normal appearance.  HENT:     Head: Normocephalic.  Eyes:     Extraocular Movements: Extraocular movements intact.     Pupils: Pupils are equal, round, and reactive  to  light.  Pulmonary:     Effort: Pulmonary effort is normal.  Musculoskeletal:     Cervical back: Normal range of motion.  Skin:    General: Skin is warm and dry.     Findings: Erythema and rash present. Rash is macular and papular.     Comments: Erythematous macular papular rash localized to the mid back that extends from the lumbar region up to the cervical spine.  Rash is and no congruent pattern.  There is no oozing, fluctuance, or drainage present.  Neurological:     General: No focal deficit present.     Mental Status: She is alert and oriented to person, place, and time.  Psychiatric:        Mood and Affect: Mood normal.        Behavior: Behavior normal.      UC Treatments / Results  Labs (all labs ordered are listed, but only abnormal results are displayed) Labs Reviewed - No data to display  EKG   Radiology No results found.  Procedures Procedures (including critical care time)  Medications Ordered in UC Medications - No data to display  Initial Impression / Assessment and Plan / UC Course  I have reviewed the triage vital signs and the nursing notes.  Pertinent labs & imaging results that were available during my care of the patient were reviewed by me and considered in my medical decision making (see chart for details).  The patient is well-appearing, she is in no acute distress, vital signs are stable.  Triamcinolone cream 0.1% prescribed for rash.  Difficult to ascertain the cause of the patient's rash, symptoms appear to be consistent with a possible contact dermatitis.  Supportive care recommendations were provided and discussed with the patient to include discontinuing use of alcohol, use of over-the-counter antihistamines, cool compresses to the area, and avoidance of rubbing or scratching or manipulating the area.  Patient advised to follow-up with her PCP if symptoms have not improved over the next 10 to 14 days.  Patient is in agreement with this plan of  care and verbalizes understanding.  All questions were answered.  Patient stable for discharge.  Final Clinical Impressions(s) / UC Diagnoses   Final diagnoses:  Rash and nonspecific skin eruption     Discharge Instructions      Take medication as prescribed.  Stop use of alcohol to the affected area. May also take over-the-counter Zyrtec (cetirizine) to help with itching. Avoid hot baths or showers while symptoms persist.  Recommend taking lukewarm baths. May apply cool cloths to the area to help with itching or discomfort. Avoid scratching, rubbing, or manipulating the areas while symptoms persist. Recommend Aveeno colloidal oatmeal bath to use to help with drying and itching. If symptoms do not improve over the next 10 to 14 days, or appear to be worsening before that time, you may follow-up in this clinic or with your primary care physician for further evaluation. Follow-up as needed.     ED Prescriptions     Medication Sig Dispense Auth. Provider   triamcinolone cream (KENALOG) 0.1 % Apply 1 Application topically 2 (two) times daily for 14 days. 45 g Yarelis Ambrosino-Warren, Sadie Haber, NP      PDMP not reviewed this encounter.   Abran Cantor, NP 04/14/23 1055

## 2023-04-14 NOTE — ED Triage Notes (Signed)
Pt reports rash to back x3 weeks. Pt reports hx of shingles.  Has tried alcohol to site.

## 2023-04-14 NOTE — Discharge Instructions (Addendum)
Take medication as prescribed.  Stop use of alcohol to the affected area. May also take over-the-counter Zyrtec (cetirizine) to help with itching. Avoid hot baths or showers while symptoms persist.  Recommend taking lukewarm baths. May apply cool cloths to the area to help with itching or discomfort. Avoid scratching, rubbing, or manipulating the areas while symptoms persist. Recommend Aveeno colloidal oatmeal bath to use to help with drying and itching. If symptoms do not improve over the next 10 to 14 days, or appear to be worsening before that time, you may follow-up in this clinic or with your primary care physician for further evaluation. Follow-up as needed.

## 2023-04-22 ENCOUNTER — Ambulatory Visit (HOSPITAL_COMMUNITY)
Admission: RE | Admit: 2023-04-22 | Discharge: 2023-04-22 | Disposition: A | Discharge status: Home / Self Care | Payer: PPO | Attending: Internal Medicine | Admitting: Internal Medicine

## 2023-04-22 ENCOUNTER — Ambulatory Visit (HOSPITAL_COMMUNITY): Payer: PPO | Admitting: Certified Registered"

## 2023-04-22 ENCOUNTER — Other Ambulatory Visit: Payer: Self-pay

## 2023-04-22 ENCOUNTER — Encounter (HOSPITAL_COMMUNITY): Payer: Self-pay | Admitting: Internal Medicine

## 2023-04-22 ENCOUNTER — Encounter (HOSPITAL_COMMUNITY)
Admission: RE | Disposition: A | Discharge status: Home / Self Care | Payer: Self-pay | Source: Home / Self Care | Attending: Internal Medicine

## 2023-04-22 ENCOUNTER — Ambulatory Visit (HOSPITAL_BASED_OUTPATIENT_CLINIC_OR_DEPARTMENT_OTHER): Payer: PPO | Admitting: Certified Registered"

## 2023-04-22 DIAGNOSIS — K529 Noninfective gastroenteritis and colitis, unspecified: Secondary | ICD-10-CM | POA: Insufficient documentation

## 2023-04-22 DIAGNOSIS — K641 Second degree hemorrhoids: Secondary | ICD-10-CM | POA: Diagnosis not present

## 2023-04-22 DIAGNOSIS — Z79899 Other long term (current) drug therapy: Secondary | ICD-10-CM | POA: Diagnosis not present

## 2023-04-22 DIAGNOSIS — G8929 Other chronic pain: Secondary | ICD-10-CM | POA: Insufficient documentation

## 2023-04-22 DIAGNOSIS — F419 Anxiety disorder, unspecified: Secondary | ICD-10-CM | POA: Diagnosis not present

## 2023-04-22 DIAGNOSIS — D126 Benign neoplasm of colon, unspecified: Secondary | ICD-10-CM

## 2023-04-22 DIAGNOSIS — K5730 Diverticulosis of large intestine without perforation or abscess without bleeding: Secondary | ICD-10-CM | POA: Diagnosis not present

## 2023-04-22 DIAGNOSIS — K635 Polyp of colon: Secondary | ICD-10-CM | POA: Diagnosis not present

## 2023-04-22 DIAGNOSIS — D120 Benign neoplasm of cecum: Secondary | ICD-10-CM | POA: Insufficient documentation

## 2023-04-22 DIAGNOSIS — K219 Gastro-esophageal reflux disease without esophagitis: Secondary | ICD-10-CM | POA: Insufficient documentation

## 2023-04-22 DIAGNOSIS — R197 Diarrhea, unspecified: Secondary | ICD-10-CM | POA: Diagnosis not present

## 2023-04-22 HISTORY — PX: BIOPSY: SHX5522

## 2023-04-22 HISTORY — PX: COLONOSCOPY WITH PROPOFOL: SHX5780

## 2023-04-22 HISTORY — PX: POLYPECTOMY: SHX5525

## 2023-04-22 SURGERY — COLONOSCOPY WITH PROPOFOL
Anesthesia: General

## 2023-04-22 MED ORDER — LIDOCAINE HCL (CARDIAC) PF 100 MG/5ML IV SOSY
PREFILLED_SYRINGE | INTRAVENOUS | Status: DC | PRN
  Administered 2023-04-22: 50 mg via INTRAVENOUS

## 2023-04-22 MED ORDER — LACTATED RINGERS IV SOLN
INTRAVENOUS | Status: DC | PRN
Start: 2023-04-22 — End: 2023-04-22

## 2023-04-22 MED ORDER — LACTATED RINGERS IV SOLN: INTRAVENOUS | Status: DC

## 2023-04-22 MED ORDER — PROPOFOL 500 MG/50ML IV EMUL
INTRAVENOUS | Status: DC | PRN
  Administered 2023-04-22: 150 ug/kg/min via INTRAVENOUS

## 2023-04-22 MED ORDER — PROPOFOL 10 MG/ML IV BOLUS
INTRAVENOUS | Status: DC | PRN
  Administered 2023-04-22: 100 mg via INTRAVENOUS

## 2023-04-22 NOTE — Discharge Instructions (Addendum)
  Colonoscopy Discharge Instructions  Read the instructions outlined below and refer to this sheet in the next few weeks. These discharge instructions provide you with general information on caring for yourself after you leave the hospital. Your doctor may also give you specific instructions. While your treatment has been planned according to the most current medical practices available, unavoidable complications occasionally occur. If you have any problems or questions after discharge, call Dr. Jena Gauss at [phone]. ACTIVITY You may resume your regular activity, but move at a slower pace for the next 24 hours.  Take frequent rest periods for the next 24 hours.  Walking will help get rid of the air and reduce the bloated feeling in your belly (abdomen).  No driving for 24 hours (because of the medicine (anesthesia) used during the test).   Do not sign any important legal documents or operate any machinery for 24 hours (because of the anesthesia used during the test).  NUTRITION Drink plenty of fluids.  You may resume your normal diet as instructed by your doctor.  Begin with a light meal and progress to your normal diet. Heavy or fried foods are harder to digest and may make you feel sick to your stomach (nauseated).  Avoid alcoholic beverages for 24 hours or as instructed.  MEDICATIONS You may resume your normal medications unless your doctor tells you otherwise.  WHAT YOU CAN EXPECT TODAY Some feelings of bloating in the abdomen.  Passage of more gas than usual.  Spotting of blood in your stool or on the toilet paper.  IF YOU HAD POLYPS REMOVED DURING THE COLONOSCOPY: No aspirin products for 7 days or as instructed.  No alcohol for 7 days or as instructed.  Eat a soft diet for the next 24 hours.  FINDING OUT THE RESULTS OF YOUR TEST Not all test results are available during your visit. If your test results are not back during the visit, make an appointment with your caregiver to find out the  results. Do not assume everything is normal if you have not heard from your caregiver or the medical facility. It is important for you to follow up on all of your test results.  SEEK IMMEDIATE MEDICAL ATTENTION IF: You have more than a spotting of blood in your stool.  Your belly is swollen (abdominal distention).  You are nauseated or vomiting.  You have a temperature over 101.  You have abdominal pain or discomfort that is severe or gets worse throughout the day.      1 small polyp found and removed in your colon you have diverticulosis throughout your colon  Samples taken to further assess the cause of diarrhea  Further recommendations to follow pending review of lab work results  At patient request, I called [name] at [phone]-no answer

## 2023-04-22 NOTE — Anesthesia Preprocedure Evaluation (Signed)
Anesthesia Evaluation  Patient identified by MRN, date of birth, ID band Patient awake    Reviewed: Allergy & Precautions, H&P , NPO status , Patient's Chart, lab work & pertinent test results, reviewed documented beta blocker date and time   Airway Mallampati: II  TM Distance: >3 FB Neck ROM: full    Dental no notable dental hx.    Pulmonary neg pulmonary ROS, pneumonia   Pulmonary exam normal breath sounds clear to auscultation       Cardiovascular Exercise Tolerance: Good hypertension, negative cardio ROS  Rhythm:regular Rate:Normal     Neuro/Psych   Anxiety      Neuromuscular disease negative neurological ROS  negative psych ROS   GI/Hepatic negative GI ROS, Neg liver ROS, PUD,GERD  ,,  Endo/Other  negative endocrine ROS    Renal/GU negative Renal ROS  negative genitourinary   Musculoskeletal   Abdominal   Peds  Hematology negative hematology ROS (+) Blood dyscrasia, anemia   Anesthesia Other Findings   Reproductive/Obstetrics negative OB ROS                             Anesthesia Physical Anesthesia Plan  ASA: 2  Anesthesia Plan: General   Post-op Pain Management:    Induction:   PONV Risk Score and Plan: Propofol infusion  Airway Management Planned:   Additional Equipment:   Intra-op Plan:   Post-operative Plan:   Informed Consent: I have reviewed the patients History and Physical, chart, labs and discussed the procedure including the risks, benefits and alternatives for the proposed anesthesia with the patient or authorized representative who has indicated his/her understanding and acceptance.     Dental Advisory Given  Plan Discussed with: CRNA  Anesthesia Plan Comments:        Anesthesia Quick Evaluation

## 2023-04-22 NOTE — Anesthesia Procedure Notes (Signed)
Date/Time: 04/22/2023 12:40 PM  Performed by: Julian Reil, CRNAPre-anesthesia Checklist: Patient identified, Emergency Drugs available, Suction available and Patient being monitored Patient Re-evaluated:Patient Re-evaluated prior to induction Oxygen Delivery Method: Nasal cannula Induction Type: IV induction Placement Confirmation: positive ETCO2

## 2023-04-22 NOTE — Transfer of Care (Signed)
Immediate Anesthesia Transfer of Care Note  Patient: [name]  Procedure(s) Performed: COLONOSCOPY WITH PROPOFOL BIOPSY POLYPECTOMY  Patient Location: Endoscopy Unit  Anesthesia Type:General  Level of Consciousness: drowsy  Airway & Oxygen Therapy: Patient Spontanous Breathing  Post-op Assessment: Report given to RN and Post -op Vital signs reviewed and stable  Post vital signs: Reviewed and stable  Last Vitals:  Vitals Value Taken Time  BP    Temp    Pulse    Resp    SpO2      Last Pain:  Vitals:   04/22/23 1238  TempSrc:   PainSc: 0-No pain      Patients Stated Pain Goal: 4 (04/22/23 1122)  Complications: No notable events documented.

## 2023-04-22 NOTE — Interval H&P Note (Signed)
History and Physical Interval Note:  04/22/2023 12:31 PM  [name]  has presented today for surgery, with the diagnosis of chronic diarrhea.  The various methods of treatment have been discussed with the patient and family. After consideration of risks, benefits and other options for treatment, the patient has consented to  Procedure(s) with comments: COLONOSCOPY WITH PROPOFOL (N/A) - 1230pm, asa 2 as a surgical intervention.  The patient's history has been reviewed, patient examined, no change in status, stable for surgery.  I have reviewed the patient's chart and labs.  Questions were answered to the patient's satisfaction.     [name]    no change.  GIP and C. difficile negative.  Diagnostic colonoscopy today.  Per plan. The risks, benefits, limitations, alternatives and imponderables have been reviewed with the patient. Questions have been answered. All parties are agreeable.

## 2023-04-22 NOTE — Op Note (Signed)
[facility] Patient Name: [name] Procedure Date: 04/22/2023 12:30 PM MRN: [id number] Date of Birth: April 28, 1941 Attending MD: Gennette Pac , MD, [id number] CSN: [id number] Age: 82 Admit Type: Outpatient Procedure:                Colonoscopy Indications:              Chronic diarrhea Providers:                Gennette Pac, MD, Buel Ream. Thomasena Edis RN, RN,                            Pandora Leiter, Technician Referring MD:             Gennette Pac, MD Medicines:                Propofol per Anesthesia Complications:            No immediate complications. Estimated Blood Loss:     Estimated blood loss was minimal. Procedure:                Pre-Anesthesia Assessment:                           - Prior to the procedure, a History and Physical                            was performed, and patient medications and                            allergies were reviewed. The patient's tolerance of                            previous anesthesia was also reviewed. The risks                            and benefits of the procedure and the sedation                            options and risks were discussed with the patient.                            All questions were answered, and informed consent                            was obtained. Prior Anticoagulants: The patient has                            taken no anticoagulant or antiplatelet agents. ASA                            Grade Assessment: II - A patient with mild systemic                            disease. After reviewing the risks and benefits,  the patient was deemed in satisfactory condition to                            undergo the procedure.                           After obtaining informed consent, the colonoscope                            was passed under direct vision. Throughout the                            procedure, the patient's blood pressure, pulse, and                             oxygen saturations were monitored continuously. The                            [id number]) scope was introduced through the                            anus and advanced to the 5 cm into the ileum. The                            colonoscopy was performed without difficulty. The                            patient tolerated the procedure well. The quality                            of the bowel preparation was adequate. The entire                            colon was well visualized. Scope In: 12:42:54 PM Scope Out: 1:01:06 PM Scope Withdrawal Time: 0 hours 14 minutes 43 seconds  Total Procedure Duration: 0 hours 18 minutes 12 seconds  Findings:      The perianal and digital rectal examinations were normal.      Scattered medium-mouthed diverticula were found in the entire colon.       Distal 5 cm of TI appeared normal.      A 5 mm polyp was found in the cecum. The polyp was sessile. The polyp       was removed with a cold snare. Resection and retrieval were complete.       Estimated blood loss was minimal.      Non-bleeding internal hemorrhoids were found during retroflexion. The       hemorrhoids were Grade II (internal hemorrhoids that prolapse but reduce       spontaneously).      The exam was otherwise without abnormality on direct and retroflexion       views. Segmental biopsies of the right and left colon taken for       histologic study. Impression:               - Diverticulosis in the entire examined colon.                           -  One 5 mm polyp in the cecum, removed with a cold                            snare. Resected and retrieved. Normal-appearing                            terminal ileum.                           - Non-bleeding internal hemorrhoids.                           - The examination was otherwise normal on direct                            and retroflexion views. Status post segmental                            biopsy. Moderate  Sedation:      Moderate (conscious) sedation was personally administered by an       anesthesia professional. The following parameters were monitored: oxygen       saturation, heart rate, blood pressure, respiratory rate, EKG, adequacy       of pulmonary ventilation, and response to care. Recommendation:           - Patient has a contact number available for                            emergencies. The signs and symptoms of potential                            delayed complications were discussed with the                            patient. Return to normal activities tomorrow.                            Written discharge instructions were provided to the                            patient.                           - Advance diet as tolerated.                           - Continue present medications.                           - Repeat colonoscopy date to be determined after                            pending pathology results are reviewed for                            surveillance.                           -  Return to GI office in 3 months. Procedure Code(s):        --- Professional ---                           [phone], Colonoscopy, flexible; with removal of                            tumor(s), polyp(s), or other lesion(s) by snare                            technique Diagnosis Code(s):        --- Professional ---                           K64.1, Second degree hemorrhoids                           D12.0, Benign neoplasm of cecum                           K52.9, Noninfective gastroenteritis and colitis,                            unspecified                           K57.30, Diverticulosis of large intestine without                            perforation or abscess without bleeding CPT copyright 2022 American Medical Association. All rights reserved. The codes documented in this report are preliminary and upon coder review may  be revised to meet current compliance requirements. Gerrit Friends. Amarilys Lyles, MD Gennette Pac, MD 04/22/2023 1:07:37 PM This report has been signed electronically. Number of Addenda: 0

## 2023-04-23 LAB — SURGICAL PATHOLOGY

## 2023-04-26 ENCOUNTER — Encounter: Payer: Self-pay | Admitting: Internal Medicine

## 2023-04-26 ENCOUNTER — Ambulatory Visit
Admission: RE | Admit: 2023-04-26 | Discharge: 2023-04-26 | Disposition: A | Discharge status: Home / Self Care | Payer: PPO | Source: Ambulatory Visit | Attending: Internal Medicine | Admitting: Internal Medicine

## 2023-04-26 DIAGNOSIS — Z1231 Encounter for screening mammogram for malignant neoplasm of breast: Secondary | ICD-10-CM

## 2023-04-29 ENCOUNTER — Encounter (HOSPITAL_COMMUNITY): Payer: Self-pay | Admitting: Internal Medicine

## 2023-04-30 NOTE — Anesthesia Postprocedure Evaluation (Signed)
Anesthesia Post Note  Patient: Chinda Shinabery  Procedure(s) Performed: COLONOSCOPY WITH PROPOFOL BIOPSY POLYPECTOMY  Patient location during evaluation: Phase II Anesthesia Type: General Level of consciousness: awake Pain management: pain level controlled Vital Signs Assessment: post-procedure vital signs reviewed and stable Respiratory status: spontaneous breathing and respiratory function stable Cardiovascular status: blood pressure returned to baseline and stable Postop Assessment: no headache and no apparent nausea or vomiting Anesthetic complications: no Comments: Late entry   No notable events documented.   Last Vitals:  Vitals:   04/22/23 1122 04/22/23 1305  BP: (!) 154/74 (!) 105/48  Pulse: 70 64  Resp: 14 15  Temp: 36.4 C 36.5 C  SpO2: 95% 96%    Last Pain:  Vitals:   04/22/23 1305  TempSrc: Axillary  PainSc: 0-No pain                 Windell Norfolk

## 2023-05-03 DIAGNOSIS — Z23 Encounter for immunization: Secondary | ICD-10-CM | POA: Diagnosis not present

## 2023-05-07 DIAGNOSIS — M48062 Spinal stenosis, lumbar region with neurogenic claudication: Secondary | ICD-10-CM | POA: Diagnosis not present

## 2023-05-12 ENCOUNTER — Telehealth: Payer: Self-pay

## 2023-05-12 ENCOUNTER — Other Ambulatory Visit: Payer: Self-pay

## 2023-05-12 MED ORDER — DICYCLOMINE HCL 20 MG PO TABS: 20.0000 mg | ORAL_TABLET | Freq: Three times a day (TID) | ORAL | 0 refills | Status: DC

## 2023-05-12 NOTE — Telephone Encounter (Signed)
Lmom for return call. Rx was sent to pharmacy on file.

## 2023-05-12 NOTE — Telephone Encounter (Signed)
Pt called stating that she is still having persistent diarrhea and wants to know what else you recommend she do. Please advise.

## 2023-05-25 ENCOUNTER — Ambulatory Visit: Payer: PPO | Admitting: Gastroenterology

## 2023-05-26 ENCOUNTER — Other Ambulatory Visit (HOSPITAL_COMMUNITY): Payer: Self-pay | Admitting: Internal Medicine

## 2023-05-26 ENCOUNTER — Ambulatory Visit: Payer: PPO | Admitting: Gastroenterology

## 2023-05-26 DIAGNOSIS — G629 Polyneuropathy, unspecified: Secondary | ICD-10-CM | POA: Diagnosis not present

## 2023-05-26 DIAGNOSIS — E785 Hyperlipidemia, unspecified: Secondary | ICD-10-CM

## 2023-05-26 DIAGNOSIS — I10 Essential (primary) hypertension: Secondary | ICD-10-CM | POA: Diagnosis not present

## 2023-06-03 ENCOUNTER — Ambulatory Visit (HOSPITAL_COMMUNITY)
Admission: RE | Admit: 2023-06-03 | Discharge: 2023-06-03 | Disposition: A | Discharge status: Home / Self Care | Payer: PPO | Source: Ambulatory Visit | Attending: Internal Medicine | Admitting: Internal Medicine

## 2023-06-03 DIAGNOSIS — E785 Hyperlipidemia, unspecified: Secondary | ICD-10-CM | POA: Insufficient documentation

## 2023-06-21 ENCOUNTER — Ambulatory Visit
Admission: EM | Admit: 2023-06-21 | Discharge: 2023-06-21 | Disposition: A | Discharge status: Home / Self Care | Payer: PPO | Attending: Nurse Practitioner | Admitting: Nurse Practitioner

## 2023-06-21 ENCOUNTER — Telehealth: Payer: Self-pay

## 2023-06-21 ENCOUNTER — Ambulatory Visit: Payer: PPO

## 2023-06-21 DIAGNOSIS — R071 Chest pain on breathing: Secondary | ICD-10-CM | POA: Diagnosis not present

## 2023-06-21 DIAGNOSIS — R9431 Abnormal electrocardiogram [ECG] [EKG]: Secondary | ICD-10-CM | POA: Diagnosis not present

## 2023-06-21 DIAGNOSIS — J069 Acute upper respiratory infection, unspecified: Secondary | ICD-10-CM | POA: Insufficient documentation

## 2023-06-21 DIAGNOSIS — R300 Dysuria: Secondary | ICD-10-CM | POA: Insufficient documentation

## 2023-06-21 DIAGNOSIS — R059 Cough, unspecified: Secondary | ICD-10-CM | POA: Insufficient documentation

## 2023-06-21 DIAGNOSIS — R079 Chest pain, unspecified: Secondary | ICD-10-CM | POA: Diagnosis not present

## 2023-06-21 LAB — POCT URINALYSIS DIP (MANUAL ENTRY)
Bilirubin, UA: NEGATIVE
Blood, UA: NEGATIVE
Glucose, UA: NEGATIVE mg/dL
Ketones, POC UA: NEGATIVE mg/dL
Nitrite, UA: NEGATIVE
Protein Ur, POC: NEGATIVE mg/dL
Spec Grav, UA: 1.015 (ref 1.010–1.025)
Urobilinogen, UA: 0.2 U/dL
pH, UA: 6.5 (ref 5.0–8.0)

## 2023-06-21 NOTE — ED Triage Notes (Signed)
Headache, congestion, cough, urinary frequency, lower back and abdominal pain with burning with urination x 1 week. Taking mucinex and sudafed.

## 2023-06-21 NOTE — Telephone Encounter (Signed)
Provider reviewed x-ray results and called patient twice with no answer to go over results. I tried to call as well with no answer. Left voice mail for patient to call back.

## 2023-06-21 NOTE — ED Provider Notes (Addendum)
[facility]    CSN: [id number] Arrival date & time: 06/21/23  1002      History   Chief Complaint Chief Complaint  Patient presents with   Cough   Urinary Frequency    HPI [name] is a 82 y.o. female.   Patient presents today with 1 week history of bodyaches, congested cough, shortness of breath, pain in her chest and rib cage when she takes a deep breath, runny nose, sore throat that is now improved, and fatigue.  Reports that she has been around people with similar symptoms.  Denies known fevers, stuffy nose, headache, ear pain, abdominal pain, nausea/vomiting, diarrhea, and change in appetite.  Has been taking over-the-counter Mucinex and Sudafed for symptoms without much relief.  She is concerned she may have pneumonia.  Also concerned about possible UTI has been ongoing for the past day.  She endorses burning with urination, however denies urinary frequency or urgency, voiding smaller amounts, hematuria, and vaginal discharge.  Reports history of frequent UTI, usually every 3 months or so.  Her PCP usually treats her and she has not been referred to urology yet.  Has not taken or tried any thing for symptom so far.    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   Bloating 11/19/2020   Dyspareunia, female 11/19/2020   Vaginal discharge 11/19/2020   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   Meningioma (HCC) 10/22/2019   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   Abnormality of gait 02/19/2016   Abnormal mammogram of left breast 04/02/2015   RLQ abdominal pain 11/24/2013   Benign paroxysmal positional vertigo 05/19/2012   Brachial neuritis or radiculitis 05/19/2012   Osteoporosis 05/19/2012   Other and unspecified hyperlipidemia 05/19/2012   Hereditary and idiopathic peripheral neuropathy 05/19/2012   ANEMIA, IRON DEFICIENCY, HX OF 10/18/2009   GERD 04/25/2009   NAUSEA WITH VOMITING 02/11/2009   DIARRHEA, RECURRENT 02/11/2009   ABDOMINAL CRAMPS 02/11/2009    Past Surgical History:  Procedure Laterality Date   ABDOMINAL HYSTERECTOMY  1979   BILATERAL OOPHORECTOMY     1996   BIOPSY  04/22/2023   Procedure: BIOPSY;  Surgeon: Corbin Ade, MD;  Location: [facility];  Service: Endoscopy;;   brain tumor     BREAST EXCISIONAL BIOPSY Left 2016   benign x 2   BREAST LUMPECTOMY WITH RADIOACTIVE SEED LOCALIZATION Left 04/02/2015   Procedure: BREAST LUMPECTOMY WITH RADIOACTIVE SEED LOCALIZATION;  Surgeon: Claud Kelp, MD;  Location: [facility];  Service: General;  Laterality: Left;   CATARACT EXTRACTION Bilateral    CHOLECYSTECTOMY     COLONOSCOPY  cameria capsule   COLONOSCOPY N/A 06/22/2013   Procedure: COLONOSCOPY;  Surgeon: Corbin Ade, MD;  Location: [facility];  Service: Endoscopy;  Laterality: N/A;  1:00 PM   COLONOSCOPY WITH PROPOFOL N/A 04/22/2023   Procedure: COLONOSCOPY WITH PROPOFOL;  Surgeon: Corbin Ade, MD;  Location: [facility];  Service: Endoscopy;  Laterality: N/A;  1230pm, asa 2   FOOT SURGERY  06/11/06   rt.     MASTOIDECTOMY     POLYPECTOMY  04/22/2023   Procedure: POLYPECTOMY;  Surgeon: Corbin Ade, MD;  Location: [facility];  Service: Endoscopy;;   retroperitoneal cyst     drainage  of   TOTAL ABDOMINAL HYSTERECTOMY W/ BILATERAL SALPINGOOPHORECTOMY  1996    OB History     Gravida  1   Para  1   Term      Preterm      AB      Living  1      SAB      IAB      Ectopic      Multiple      Live Births               Home Medications    Prior to Admission medications   Medication Sig Start Date End Date Taking? Authorizing Provider  acetaminophen (TYLENOL) 500 MG tablet Take by mouth every 6 (six) hours as needed for pain.   Yes [provider]  alendronate (FOSAMAX) 70 MG tablet Take 70 mg by mouth once a week. 10/29/20  Yes [provider]  amLODipine (NORVASC) 5 MG tablet Take 5 mg by mouth daily.   Yes [provider]  clonazePAM (KLONOPIN) 0.5 MG tablet Take 0.5 mg by mouth daily as needed for anxiety.   Yes [provider]  pregabalin (LYRICA) 150 MG capsule Take 1 capsule (150 mg total) by mouth 3 (three) times daily. 08/30/14  Yes York Spaniel, MD  rosuvastatin (CRESTOR) 20 MG tablet SMARTSIG:1 Tablet(s) By Mouth Every Evening 06/15/23  Yes [provider]  zolpidem (AMBIEN) 5 MG tablet Take 5 mg by mouth at bedtime as needed. 11/14/19  Yes [provider]  dicyclomine (BENTYL) 20 MG tablet Take 1 tablet (20 mg total) by mouth 4 (four) times daily -  before meals and at bedtime. 05/12/23 06/11/23  Corbin Ade, MD  omeprazole (PRILOSEC) 20 MG capsule Take 1 capsule (20 mg total) by mouth daily. 11/10/22   Aida Raider, NP  Sod Picosulfate-Mag Ox-Cit Acd (CLENPIQ) 10-3.5-12 MG-GM -GM/175ML SOLN Take 1 kit by mouth as directed. 04/05/23   Rourk, Gerrit Friends, MD    Family History Family History  Adopted: Yes    Social History Social History   Tobacco Use   Smoking status: Never   Smokeless tobacco: Never  Vaping Use   Vaping status: Never Used  Substance Use Topics   Alcohol use: No   Drug use: No     Allergies   Prednisone, Ciprofloxacin, and Sulfonamide  derivatives   Review of Systems Review of Systems Per HPI  Physical Exam Triage Vital Signs ED Triage Vitals [06/21/23 1126]  Encounter Vitals Group     BP (!) 148/71     Systolic BP Percentile      Diastolic BP Percentile      Pulse Rate 69     Resp 16     Temp 98.1 F (36.7 C)     Temp Source Oral  SpO2 96 %     Weight      Height      Head Circumference      Peak Flow      Pain Score 6     Pain Loc      Pain Education      Exclude from Growth Chart    No data found.  Updated Vital Signs BP (!) 148/71 (BP Location: Right Arm)   Pulse 69   Temp 98.1 F (36.7 C) (Oral)   Resp 16   SpO2 96%   Visual Acuity Right Eye Distance:   Left Eye Distance:   Bilateral Distance:    Right Eye Near:   Left Eye Near:    Bilateral Near:     Physical Exam   UC Treatments / Results  Labs (all labs ordered are listed, but only abnormal results are displayed) Labs Reviewed  POCT URINALYSIS DIP (MANUAL ENTRY) - Abnormal; Notable for the following components:      Result Value   Leukocytes, UA Small (1+) (*)    All other components within normal limits  URINE CULTURE    EKG   Radiology DG Chest 2 View  Result Date: 06/21/2023 CLINICAL DATA:  Cough, chest pain. EXAM: CHEST - 2 VIEW COMPARISON:  November 07, 2012. FINDINGS: The heart size and mediastinal contours are within normal limits. Both lungs are clear. The visualized skeletal structures are unremarkable. IMPRESSION: No active cardiopulmonary disease. Electronically Signed   By: Lupita Raider M.D.   On: 06/21/2023 12:57    Procedures Procedures (including critical care time)  Medications Ordered in UC Medications - No data to display  Initial Impression / Assessment and Plan / UC Course  I have reviewed the triage vital signs and the nursing notes.  Pertinent labs & imaging results that were available during my care of the patient were reviewed by me and considered in my medical decision making (see  chart for details).    1. Viral URI with cough 2. Chest pain varying with breathing Overall, vitals and exam are reassuring today Suspect viral etiology as cause of cough and pain in rib cage Viral testing deferred given length of symptoms Chest x-ray imaging obtained per patient request and not formally read by radiologist prior to patient's discharge-will contact patient later today with results EKG today shows normal sinus rhythm with left axis deviation, ventricular rate of 65 bpm, no new ST or T wave abnormalities when compared with previous EKG Supportive care discussed including Tylenol, Motrin I offered to prescribe a cough suppressant medication, but patient declines  3.  Dysuria Urinalysis with small leukocyte Estrace, otherwise unremarkable Not clear UTI, will defer treatment until urine culture results given history of frequent UTI Return and ER precautions discussed in the meantime  Patient left without discharge paperwork  Update: Chest x-ray is negative for pneumonia.  No change to treatment plan.  Attempted to call patient twice and she did not answer.  I sent a message to the clinical staff to please get in touch with her regarding chest x-ray results. Final Clinical Impressions(s) / UC Diagnoses   Final diagnoses:  Dysuria  Viral URI with cough  Chest pain varying with breathing     Discharge Instructions      I suspect a viral illness is causing your upper respiratory symptoms.  The chest x-ray is negative.  Your EKG is reassuring.  Your vital signs are also reassuring.  I recommend continuing Mucinex, Tylenol/ibuprofen as  needed for pain.  I offered cough suppressant medication, or you did not want me to prescribe that for you today.  Please seek care if your symptoms do not improve with treatment or if they worsen, seek care in the emergency room.  Regarding the urinary symptoms, the urine sample is inconclusive for a UTI.  We are sending for a urine culture  and will contact you later this week if the urine culture shows that there is bacteria that requires treatment with antibiotic medicine.  In the meantime, push hydration plenty of water.  Seek care in the emergency room if you develop severe pain, nausea/vomiting and unable to keep fluids down.     ED Prescriptions   None    PDMP not reviewed this encounter.   Valentino Nose, NP 06/21/23 1438    Valentino Nose, NP 06/21/23 1451

## 2023-06-21 NOTE — Discharge Instructions (Addendum)
I suspect a viral illness is causing your upper respiratory symptoms.  The chest x-ray is negative.  Your EKG is reassuring.  Your vital signs are also reassuring.  I recommend continuing Mucinex, Tylenol/ibuprofen as needed for pain.  I offered cough suppressant medication, or you did not want me to prescribe that for you today.  Please seek care if your symptoms do not improve with treatment or if they worsen, seek care in the emergency room.  Regarding the urinary symptoms, the urine sample is inconclusive for a UTI.  We are sending for a urine culture and will contact you later this week if the urine culture shows that there is bacteria that requires treatment with antibiotic medicine.  In the meantime, push hydration plenty of water.  Seek care in the emergency room if you develop severe pain, nausea/vomiting and unable to keep fluids down.

## 2023-06-23 ENCOUNTER — Telehealth (HOSPITAL_COMMUNITY): Payer: Self-pay

## 2023-06-23 LAB — URINE CULTURE: Culture: 10000 — AB

## 2023-06-23 MED ORDER — CEPHALEXIN 500 MG PO CAPS
500.0000 mg | ORAL_CAPSULE | Freq: Four times a day (QID) | ORAL | 0 refills | Status: AC
Start: 2023-06-23 — End: 2023-06-28

## 2023-06-23 NOTE — Telephone Encounter (Signed)
Per Phil Dopp, NP, "Per Phil Dopp, NP, "Let's treat her with Keflex 500 mg qid x 5 day. Please stress importance of f/u with Urology for recurrenti UTI."   Attempted to reach patient x1. LVM.  Rx sent to pharmacy on file.

## 2023-07-21 DIAGNOSIS — G8929 Other chronic pain: Secondary | ICD-10-CM | POA: Insufficient documentation

## 2023-07-21 NOTE — Progress Notes (Signed)
 Name: [name] DOB: [birth year] MRN: [id number]  History of Present Illness: Ms. Fulop is a 83 y.o. female who presents today as a new patient at [facility] . All available relevant medical records have been reviewed. She is accompanied by her husband Johnny.  She reports chief complaint of recurrent UTls.  Urine culture results in past 12 months: - 06/21/2023: Positive for Pseudomonas aeruginosa - No additional urine culture results in past 12 months found per chart review.   Recent history:  > 06/21/2023:  - Seen in ER for URI and UTI.  - Per note: possible UTI has been ongoing for the past day. She endorses burning with urination, however denies urinary frequency or urgency, voiding smaller amounts, hematuria, and vaginal discharge. Reports history of frequent UTI, usually every 3 months or so. Her PCP usually treats her and she has not been referred to urology yet. - UA showed small leukocytes; negative for nitrites or blood. - Urine culture positive for Pseudomonas aeruginosa. - Treated with Keflex  500 mg 4x/day for 5 days.  Urinary Symptoms: She reports 3 UTl's in the last year. When present, UTI symptoms include dysuria, increased urinary urgency, frequency, malodorous urine. She reports that UTI symptoms do not seem to correlate with intercourse. She reports acute UTI symptoms today.  At baseline: She denies urinary urgency, frequency, dysuria, incontinence, gross hematuria, hesitancy, straining to void, or sensations of incomplete emptying.  She reports that she doesn't really get the urge to void so she voids on a time-basis. She is also trying to increase her water  intake daily.  She reports voiding 3 times per day and 2 times at night. She denies pushing on a bulge in order to empty her bladder.  She denies significant caffeine intake (just 2 cups of coffee in the morning).  Reports snoring, denies prior sleep study / OSA.  She  denies history of pyelonephritis.  She denies history of kidney stones.  Vaginal / prolapse Symptoms: She denies vaginal bulge sensation.  She denies seeing a vaginal bulge. She denies vaginal pain, bleeding, or discharge.  Reports some vaginal burning sensation when urine touches that tissue.  Bowel Symptoms: She reports intermittent diarrhea, which has slowed down since her colonoscopy with polypectomy on 04/22/2023. She denies constipation, rectal bleeding, pain with defecation, straining to defecate, or fecal incontinence.  Past OB/GYN History: OB History     Gravida  1   Para  1   Term      Preterm      AB      Living  1      SAB      IAB      Ectopic      Multiple      Live Births             She denies being sexually active.  She has 1 child(ren) who were delivered vaginally. She is post menopausal.  She reports history of TAH and BSO.  Fall Screening: Do you usually have a device to assist in your mobility? No   Medications: Current Outpatient Medications  Medication Sig Dispense Refill   acetaminophen  (TYLENOL ) 500 MG tablet Take by mouth every 6 (six) hours as needed for pain.     alendronate (FOSAMAX) 70 MG tablet Take 70 mg by mouth once a week.     amLODipine (NORVASC) 5 MG tablet Take 5 mg by mouth daily.     clonazePAM (KLONOPIN) 0.5 MG tablet Take 0.5 mg  by mouth daily as needed for anxiety.     estradiol  (ESTRACE ) 0.1 MG/GM vaginal cream Discard plastic applicator. Insert a blueberry size amount (approximately 1 gram) of cream on fingertip inside vagina at bedtime every night for 1 week then every other night. For long term use. 30 g 3   omeprazole  (PRILOSEC) 20 MG capsule Take 1 capsule (20 mg total) by mouth daily. 90 capsule 3   pregabalin  (LYRICA ) 150 MG capsule Take 1 capsule (150 mg total) by mouth 3 (three) times daily. 90 capsule 5   rosuvastatin (CRESTOR) 20 MG tablet SMARTSIG:1 Tablet(s) By Mouth Every Evening     Sod  Picosulfate-Mag Ox-Cit Acd (CLENPIQ ) 10-3.5-12 MG-GM -GM/175ML SOLN Take 1 kit by mouth as directed. 350 mL 0   zolpidem (AMBIEN) 5 MG tablet Take 5 mg by mouth at bedtime as needed.     dicyclomine  (BENTYL ) 20 MG tablet Take 1 tablet (20 mg total) by mouth 4 (four) times daily -  before meals and at bedtime. 120 tablet 0   No current facility-administered medications for this visit.    Allergies: Allergies  Allergen Reactions   Prednisone    Ciprofloxacin Hives and Rash   Sulfonamide Derivatives Hives and Rash    Past Medical History:  Diagnosis Date   Abnormality of gait 02/19/2016   Anxiety    Benign positional vertigo    Broken foot    left leg and toe   Bronchitis    Charcot Marie Tooth muscular atrophy    Hemoglobin low    and hemocrit   Hyperlipidemia    Hypertension    Iron deficiency anemia    Osteoporosis    Peripheral neuropathy    Pneumonia    Prepyloric ulcer    RLQ abdominal pain 11/24/2013   Hx of ovarian remnant ,now has pain down right leg  Will get US    Shingles    Right V1 distribution   Past Surgical History:  Procedure Laterality Date   ABDOMINAL HYSTERECTOMY  1979   BILATERAL OOPHORECTOMY     1996   BIOPSY  04/22/2023   Procedure: BIOPSY;  Surgeon: Shaaron Lamar HERO, MD;  Location: [facility];  Service: Endoscopy;;   brain tumor     BREAST EXCISIONAL BIOPSY Left 2016   benign x 2   BREAST LUMPECTOMY WITH RADIOACTIVE SEED LOCALIZATION Left 04/02/2015   Procedure: BREAST LUMPECTOMY WITH RADIOACTIVE SEED LOCALIZATION;  Surgeon: Elon Pacini, MD;  Location: [facility];  Service: General;  Laterality: Left;   CATARACT EXTRACTION Bilateral    CHOLECYSTECTOMY     COLONOSCOPY     cameria capsule   COLONOSCOPY N/A 06/22/2013   Procedure: COLONOSCOPY;  Surgeon: Lamar HERO Shaaron, MD;  Location: [facility];  Service: Endoscopy;  Laterality: N/A;  1:00 PM   COLONOSCOPY WITH PROPOFOL  N/A 04/22/2023   Procedure: COLONOSCOPY WITH PROPOFOL ;   Surgeon: Shaaron Lamar HERO, MD;  Location: [facility];  Service: Endoscopy;  Laterality: N/A;  1230pm, asa 2   FOOT SURGERY  06/11/06   rt.     MASTOIDECTOMY     POLYPECTOMY  04/22/2023   Procedure: POLYPECTOMY;  Surgeon: Shaaron Lamar HERO, MD;  Location: [facility];  Service: Endoscopy;;   retroperitoneal cyst     drainage of   TOTAL ABDOMINAL HYSTERECTOMY W/ BILATERAL SALPINGOOPHORECTOMY  1996   Family History  Adopted: Yes   Social History   Socioeconomic History   Marital status: Married    Spouse name:  Not on file   Number of children: 1   Years of education: college2   Highest education level: Not on file  Occupational History   Occupation: Retired  Tobacco Use   Smoking status: Never   Smokeless tobacco: Never  Vaping Use   Vaping status: Never Used  Substance and Sexual Activity   Alcohol use: No   Drug use: No   Sexual activity: Yes    Birth control/protection: Surgical    Comment: hyst  Other Topics Concern   Not on file  Social History Narrative   Lives at home, married   Right-handed   Drinks mostly Gatorade   Social Drivers of Health   Financial Resource Strain: Low Risk  (11/19/2020)   Overall Financial Resource Strain (CARDIA)    Difficulty of Paying Living Expenses: Not hard at all  Food Insecurity: No Food Insecurity (11/19/2020)   Hunger Vital Sign    Worried About Running Out of Food in the Last Year: Never true    Ran Out of Food in the Last Year: Never true  Transportation Needs: No Transportation Needs (11/19/2020)   PRAPARE - Administrator, Civil Service (Medical): No    Lack of Transportation (Non-Medical): No  Physical Activity: Insufficiently Active (11/19/2020)   Exercise Vital Sign    Days of Exercise per Week: 2 days    Minutes of Exercise per Session: 30 min  Stress: No Stress Concern Present (11/19/2020)   [employer] of Occupational Health - Occupational Stress Questionnaire    Feeling of Stress : Not at all   Social Connections: Moderately Isolated (11/19/2020)   Social Connection and Isolation Panel [NHANES]    Frequency of Communication with Friends and Family: Three times a week    Frequency of Social Gatherings with Friends and Family: Twice a week    Attends Religious Services: Never    Database Administrator or Organizations: No    Attends Banker Meetings: Never    Marital Status: Married  Catering Manager Violence: Not At Risk (11/19/2020)   Humiliation, Afraid, Rape, and Kick questionnaire    Fear of Current or Ex-Partner: No    Emotionally Abused: No    Physically Abused: No    Sexually Abused: No    SUBJECTIVE  Review of Systems Constitutional: Patient denies any unintentional weight loss or change in strength lntegumentary: Patient denies any rashes or pruritus Cardiovascular: Patient denies chest pain or syncope Respiratory: Patient denies shortness of breath Gastrointestinal: Patient denies nausea, vomiting, constipation, or diarrhea Musculoskeletal: Patient denies muscle cramps or weakness Neurologic: Patient denies convulsions or seizures Allergic/Immunologic: Patient denies recent allergic reaction(s) Hematologic/Lymphatic: Patient denies bleeding tendencies Endocrine: Patient denies heat/cold intolerance  GU: As per HPI.  OBJECTIVE Vitals:   07/27/23 0916  BP: (!) 160/73  Pulse: 69  Temp: 97.7 F (36.5 C)   There is no height or weight on file to calculate BMI.  Physical Examination Constitutional: No obvious distress; patient is non-toxic appearing  Cardiovascular: No visible lower extremity edema.  Respiratory: The patient does not have audible wheezing/stridor; respirations do not appear labored  Gastrointestinal: Abdomen non-distended Musculoskeletal: Normal ROM of UEs  Skin: No obvious rashes/open sores  Neurologic: CN 2-12 grossly intact Psychiatric: Answered questions appropriately with normal affect   Hematologic/Lymphatic/Immunologic: No obvious bruises or sites of spontaneous bleeding  Urine microscopy: 6-10 WBC/hpf, otherwise unremarkable PVR: 0 ml  ASSESSMENT History of recurrent UTIs - Plan: Urinalysis, Routine w reflex microscopic, BLADDER SCAN AMB  NON-IMAGING, estradiol  (ESTRACE ) 0.1 MG/GM vaginal cream  Atrophic vaginitis - Plan: estradiol  (ESTRACE ) 0.1 MG/GM vaginal cream  History of UTls with insufficient urine culture data to formally validate recurrent UTI diagnosis:  We reviewed the diagnostic criteria for recurrent UTI and that we do not currently have adequate urine culture data to support or refute the formal diagnosis of recurrent UTI. Will request any additional outside urine cultures for review.  For UTI prevention we discussed the following options, for which detailed information has been included in Patient Information section of today's After Visit Summary. > Maintain adequate fluid intake daily to flush out the urinary tract. > Go to the bathroom to urinate every 4-6 hours while awake to minimize urinary stasis / bacterial overgrowth in the bladder. > Proanthocyanidin (PAC) supplement 36 mg daily; must be soluble (insoluble form of PAC will be ineffective). Recommend Ellura.  > D-mannose 2 g daily to minimize bacterial adherence in urinary tract > Vitamin C supplement to acidify urine to suppress bacterial growth > Probiotic to maintain healthy vaginal microbiome > Topical vaginal estrogen for vaginal atrophy.  We discussed the following: - The etiology and consequences of urogenital epithelial atrophy was explained to patient. The thinning of the epithelium of the urethra can contribute to urinary urgency and frequency syndromes. In addition, the normal bacterial flora that colonizes the perineum may contribute to UTI risk because the thin urethral epithelium allows the bacteria to become adherent and the change in vaginal pH can disrupt the vaginal / urethral  microbiome and allow for bacterial overgrowth. - The normal bacterial flora that colonizes the perineum may contribute to UTI risk because the thin urethral epithelium allows the bacteria to become adherent and the change in vaginal pH can disrupt the vaginal / urethral microbiome and allow for bacterial overgrowth.  - Topical vaginal estrogen replacement will take about 3 months to restore the vaginal pH. - Topical vaginal replacement may sting/burn initially due to severe dryness, which will improve with ongoing treatment as the tissue gets healthier.  - OK to have sex with any of the topical vaginal estrogen replacement options.  - There have been studies that evaluate use of low-dose intravaginal estrogen cream that shows minimal systemic absorption that is negligible after 3 weeks. There have been no studies indicating that use of topical vaginal estrogen increases a patient's risk of contributing to breast cancer development or recurrence.  2. Vaginal atrophy. We discussed this as likely cause for her discomfort when urine touches vaginal tissue. She elected to start topical vaginal estrogen cream for that and for UTI prevention.  3. Nocturia.  Advised to minimize caffeine intake, minimize evening fluid intake, and consider discussing snoring / possible sleep study at her upcoming appointment with her PCP.  Will plan for follow up in 3 months or sooner if needed. Pt verbalized understanding and agreement. All questions were answered.  PLAN Advised the following: 1. Requesting records from PCP for prior urine testing. 2. Start topical vaginal estrogen cream as prescribed. 3. Maintain adequate fluid intake daily to flush out the urinary tract. 4. Urinate every 4-6 hours while awake to minimize urinary stasis / bacterial overgrowth in the bladder. 5. Consider OTC supplements for UTI prevention. 6. Minimize caffeine intake. 7. Minimize evening fluid intake. 8. Return in about 3 months  (around 10/25/2023) for UA, PVR, & f/u with Lauraine Oz NP.  Orders Placed This Encounter  Procedures   Urinalysis, Routine w reflex microscopic   BLADDER SCAN AMB NON-IMAGING  Total time spent caring for the patient today was over 45 minutes. This includes time spent on the date of the visit reviewing the patient's chart before the visit, time spent during the visit, and time spent after the visit on documentation. Over 50% of that time was spent in face-to-face time with this patient for direct counseling. E&M based on time and complexity of medical decision making.  It has been explained that the patient is to follow regularly with their PCP in addition to all other providers involved in their care and to follow instructions provided by these respective offices. Patient advised to contact urology clinic if any urologic-pertaining questions, concerns, new symptoms or problems arise in the interim period.  Patient Instructions  UTI prevention / management:  Difference between Urinalysis (urine dipstick test) and Urine culture:  Urinalysis (urine dipstick test): A quick office test used as an indicator to determine whether or not further testing is necessary (such as a urine culture, urine microscopy, etc.) The urinalysis cannot differentiate a true bacterial UTI or give a definitive diagnosis for the findings.  Urine culture: May be performed based on the findings of a urinalysis to evaluate for UTI. Grows out on a petri dish for 48-72 hours. Provides important information about: whether or not bacterial growth is present and if so: what the predominant bacteria is which antibiotics will work best against that bacteria That information is important so that we can diagnose and treat patients appropriately as there are other conditions which may mimic UTls which must not be missed (such as cancer, interstitial cystitis, stones, etc.). Assists us  with antibiotic stewardship to minimize  patient's risk for developing antibiotic resistance (getting to a point where no antibiotics work anymore).  Options when UTI symptoms occur: 1. Call [facility] [place] and request to speak with triage nurse (phone # [phone], select option 3). In accordance with clinic guidelines the nurse will determine next steps based on patient-reported symptoms, which may include: same-day lab visit to provide urine specimen, recommendation to schedule Urology office visit appointment for further evaluation, recommendation to proceed to ER, etc. 2. Call your Primary Care Provider (PCP) office to request urgent / same-day visit. Be sure to request for urine culture to be ordered and have results faxed to Urology (fax # [phone]).  3. Go to urgent care. Be sure to request for urine culture to be ordered and have results faxed to Urology (fax # [phone]).   For bladder pain/ burning with urination: - Can take over-the-counter Pyridium (phenazopyridine; commonly known under the AZO brand) for a few days as needed. Limit use to no more than 3 days consecutively due to risk for methemoglobinemia, liver function issues, and bone health damage with long term use of Pyridium. - Alternative: Prescription urinary analgesics (such as Uribel, Urogesic blue, Urelle, Uro-MP). Often expensive / poorly covered by insurance unfortunately.  Options / recommendations for UTI prevention: - Topical vaginal estrogen for vaginal atrophy (aka Genitourinary Syndrome of Menopause (GSM)). - Adequate daily fluid intake to flush out the urinary tract. - Go to the bathroom to urinate every 4-6 hours while awake to minimize urinary stasis / bacterial overgrowth in the bladder. - Proanthocyanidin (PAC) supplement 36 mg daily; must be soluble (insoluble form of PAC will be ineffective). Recommended brand: Ellura. This is an over-the-counter supplement (often must be found/ purchased online) supplement derived from  cranberries with concentrated active component: Proanthocyanidin (PAC) 36 mg daily. Decreases bacterial adherence to bladder lining.  - D-mannose  powder (2 grams daily). This is an over-the-counter supplement which decreases bacterial adherence to bladder lining (it is a sugar that inhibits bacterial adherence to urothelial cells by binding to the pili of enteric bacteria). Take as per manufacturer recommendation. Can be used as an alternative or in addition to the concentrated cranberry supplement.  - Vitamin C supplement to acidify urine to minimize bacterial growth.  - Probiotic to maintain healthy vaginal microbiome to suppress bacteria at urethral opening. Brand recommendations: Verlon (includes probiotic & D-mannose ), Feminine Balance (highest concentration of lactobacillus) or Hyperbiotic Pro 15.  Note for patients with diabetes:  - Be aware that D-mannose contains sugar.  Note for patients with interstitial cystitis (IC):  - Patients with IC should typically avoid cranberry/ PAC supplements and Vitamin C supplements due to their acidity, which may exacerbate IC-related bladder pain. - Symptoms of true bacterial UTI can overlap / mimic symptoms of an IC flare up. Antibiotic use is NOT indicated for IC flare ups. Urine culture needed prior to antibiotic treatment for IC patients. The goal is to minimize your risk for developing antibiotic-resistant bacteria.    Vaginal atrophy I Genitourinary syndrome of menopause (GSM):  What it is: Changes in the vaginal environment (including the vulva and urethra) including: Thinning of the epithelium (skin/ mucosa surface) Can contribute to urinary urgency and frequency Can contribute to dryness, itching, irritation of the vulvar and vaginal tissue Can contribute to pain with intercourse Can contribute to physical changes of the labia, vulva, and vagina such as: Narrowing of the vaginal opening Decreased vaginal length Loss of labial  architecture Labial adhesions Pale color of vulvovaginal tissue  Loss of pubic hair Allows bacteria to become adherent  Results in increased risk for urinary tract infection (UTI) due to bacterial overgrowth and migration up the urethra into the bladder Change in vaginal pH (acid/ base balance) Allows for alteration / disruption of the normal bacterial flora / microbiome Results in increased risk for urinary tract infection (UTI) due to bacterial overgrowth  Treatment options: Over-the-counter lubricants (see list below). Prescription vaginal estrogen replacement. Options: Topical vaginal estrogen cream Estrace , Premarin, or compounded estradiol  cream/ gel We advise: Discard plastic applicator as that tends to use more medication than you need, which is not harmful but wastes / uses up the medication. Also the plastic applicator may cause discomfort. Insert blueberry size amount of medication via the tip of your finger inside vagina nightly for 1 week then 2-3 times per week (long term). Estring  vaginal ring Exchanged every 3 months (either at home or in office by provider) Vagifem  vaginal tablet Inserted nightly for 2 weeks then twice a week (long term) lntrarosa vaginal suppository Vaginal DHEA: converts to estrogen in vaginal tissue without systemic effect Inserted nightly (long term) Vaginal laser therapy (Mona Lisa touch) Performed in 3 treatments each 6 weeks apart (available in our [place] office). Can feel like a sunburn for 3-4 days after each treatment until new skin heals in. Usually not covered by insurance. Estimated cost is $1500 for all 3 sessions.  FYI regarding prescription vaginal estrogen treatment options: All topical vaginal estrogen replacement options are equivalent in terms of efficacy. Topical vaginal estrogen replacement will take about 3 months to be effective. OK to have sex with any of the topical vaginal estrogen replacement options. Topical vaginal  estrogen replacement may sting/burn initially due to severe dryness, which will improve with ongoing treatment. There have been studies that evaluate use of low-dose intravaginal estrogen that show minimal  systemic absorption which is negligible after 3 weeks. There have been no studies indicating increased risk of contributing to cancer development or recurrence.  Topical vaginal estrogen cream safe to use with breast cancer history [email or web address]  Topical vaginal estrogen cream safe to use with blood clot history [email or web address]   Lubricants and Moisturizers for Treating Genitourinary Syndrome of Menopause and Vulvovaginal Atrophy Treatment Comments I Available Products   Lubricants   Water -based Ingredients: Deionized water , glycerin, propylene glycol; latex safe; rare irritation; dry out with extended sexual activity Astroglide, Good Clean Love, K-Y Jelly, Natural, Organic, Pink, Sliquid, Sylk, Yes    Oil Based Ingredients: avocado, olive, peanut, corn; latex safe; can be used with silicone products; staining; safe (unless peanut allergy); non-irritating Coconut oil, vegetable oil, vitamin E oil  Silicone-Based Ingredients: Silicone polymers; staining; typically nonirritating, long lasting; waterproof; should not be used with silicone dilators, sexual toys, or gynecologic products Astroglide X, Oceanus Ultra Pure, Pink Silicone, Pjur Eros, Replens Silky Smooth, Silicone Premium JO, SKYN, Uberlube, [employer] Based Minimize harm to sperm motility; designed Astroglide TTC, Conceive Plus, Pre for couples trying to conceive Seed, Yes Baby  Fertility Friendly Minimize harm to sperm motility; designed Astroglide, TTC, Conceive Plus,  Pre for couples trying to conceive Seed, Yes Baby  Vaginal Moisturizers   Vaginal Moisturizers For maintenance use 1 to 3 times weekly; can benefit women with dryness, chafing with AOL, and recurrent vaginal infections irrespective of sexual activity timing Balance Active Menopause Vaginal Moisturizing Lubricant, Canesintima Intimate Moisturizer, Replens, Rephresh, Sylk Natural Intimate Moisturizer, Yes Vaginal Moisturizer  Hybrids Properties of both water  and silicone-based products (combination of a vaginal lubricant and moisturizer); Non-irritating; good option for women with allergies and sensitivities Lubrigyn, Luvena  Suppositories Hyaluronic acid to retain moisture Revaree  Vulvar Soothing Creams/Oils    Medicated CreamsP ain and burn relief; Ingredients: 4% Lidocaine , Aloe Vera gel Releveum ([place])  Non-Medicated Creams For anti-itch and moisture/maintenance; Ingredients: Coconut oil, Avocado oil, Shea Butter, Olive oil, Vitamin E Vajuvenate, Vmagic  Oils !For moisture/maintenance !Coconut oil, Vitamin E oil, Emu oil     Electronically signed by:  Lauraine KYM Oz, MSN, FNP-C, CUNP 07/27/2023 10:29 AM

## 2023-07-27 ENCOUNTER — Ambulatory Visit: Payer: PPO | Admitting: Urology

## 2023-07-27 ENCOUNTER — Encounter: Payer: Self-pay | Admitting: Urology

## 2023-07-27 VITALS — BP 160/73 | HR 69 | Temp 97.7°F

## 2023-07-27 DIAGNOSIS — Z87440 Personal history of urinary (tract) infections: Secondary | ICD-10-CM | POA: Diagnosis not present

## 2023-07-27 DIAGNOSIS — N952 Postmenopausal atrophic vaginitis: Secondary | ICD-10-CM | POA: Diagnosis not present

## 2023-07-27 LAB — MICROSCOPIC EXAMINATION: Bacteria, UA: NONE SEEN

## 2023-07-27 LAB — URINALYSIS, ROUTINE W REFLEX MICROSCOPIC
Bilirubin, UA: NEGATIVE
Glucose, UA: NEGATIVE
Ketones, UA: NEGATIVE
Nitrite, UA: NEGATIVE
Protein,UA: NEGATIVE
RBC, UA: NEGATIVE
Specific Gravity, UA: 1.01 (ref 1.005–1.030)
Urobilinogen, Ur: 0.2 mg/dL (ref 0.2–1.0)
pH, UA: 7 (ref 5.0–7.5)

## 2023-07-27 MED ORDER — ESTRADIOL 0.1 MG/GM VA CREA
TOPICAL_CREAM | VAGINAL | 3 refills | Status: AC
Start: 2023-07-27 — End: ?

## 2023-07-27 NOTE — Patient Instructions (Signed)
 UTI prevention / management:  Difference between Urinalysis (urine dipstick test) and Urine culture:  Urinalysis (urine dipstick test): A quick office test used as an indicator to determine whether or not further testing is necessary (such as a urine culture, urine microscopy, etc.) The urinalysis cannot differentiate a true bacterial UTI or give a definitive diagnosis for the findings.  Urine culture: May be performed based on the findings of a urinalysis to evaluate for UTI. Grows out on a petri dish for 48-72 hours. Provides important information about: whether or not bacterial growth is present and if so: what the predominant bacteria is which antibiotics will work best against that bacteria That information is important so that we can diagnose and treat patients appropriately as there are other conditions which may mimic UTls which must not be missed (such as cancer, interstitial cystitis, stones, etc.). Assists [place] with antibiotic stewardship to minimize patient's risk for developing antibiotic resistance (getting to a point where no antibiotics work anymore).  Options when UTI symptoms occur: 1. Call [facility] [place] and request to speak with triage nurse (phone # [phone], select option 3). In accordance with clinic guidelines the nurse will determine next steps based on patient-reported symptoms, which may include: same-day lab visit to provide urine specimen, recommendation to schedule Urology office visit appointment for further evaluation, recommendation to proceed to ER, etc. 2. Call your Primary Care Provider (PCP) office to request urgent / same-day visit. Be sure to request for urine culture to be ordered and have results faxed to Urology (fax # [phone]).  3. Go to urgent care. Be sure to request for urine culture to be ordered and have results faxed to Urology (fax # [phone]).   For bladder pain/ burning with urination: - Can take over-the-counter  Pyridium (phenazopyridine; commonly known under the "AZO" brand) for a few days as needed. Limit use to no more than 3 days consecutively due to risk for methemoglobinemia, liver function issues, and bone health damage with long term use of Pyridium. - Alternative: Prescription urinary analgesics (such as Uribel, Urogesic blue, Urelle, Uro-MP). Often expensive / poorly covered by insurance unfortunately.  Options / recommendations for UTI prevention: - Topical vaginal estrogen for vaginal atrophy (aka Genitourinary Syndrome of Menopause (GSM)). - Adequate daily fluid intake to flush out the urinary tract. - Go to the bathroom to urinate every 4-6 hours while awake to minimize urinary stasis / bacterial overgrowth in the bladder. - Proanthocyanidin (PAC) supplement 36 mg daily; must be soluble (insoluble form of PAC will be ineffective). Recommended brand: Ellura. This is an over-the-counter supplement (often must be found/ purchased online) supplement derived from cranberries with concentrated active component: Proanthocyanidin (PAC) 36 mg daily. Decreases bacterial adherence to bladder lining.  - D-mannose powder (2 grams daily). This is an over-the-counter supplement which decreases bacterial adherence to bladder lining (it is a sugar that inhibits bacterial adherence to urothelial cells by binding to the pili of enteric bacteria). Take as per manufacturer recommendation. Can be used as an alternative or in addition to the concentrated cranberry supplement.  - Vitamin C supplement to acidify urine to minimize bacterial growth.  - Probiotic to maintain healthy vaginal microbiome to suppress bacteria at urethral opening. Brand recommendations: Darrold Junker (includes probiotic & D-mannose ), Feminine Balance (highest concentration of lactobacillus) or Hyperbiotic Pro 15.  Note for patients with diabetes:  - Be aware that D-mannose contains sugar.  Note for patients with interstitial cystitis (IC):  -  Patients with IC should  typically avoid cranberry/ PAC supplements and Vitamin C supplements due to their acidity, which may exacerbate IC-related bladder pain. - Symptoms of true bacterial UTI can overlap / mimic symptoms of an IC flare up. Antibiotic use is NOT indicated for IC flare ups. Urine culture needed prior to antibiotic treatment for IC patients. The goal is to minimize your risk for developing antibiotic-resistant bacteria.    Vaginal atrophy I Genitourinary syndrome of menopause (GSM):  What it is: Changes in the vaginal environment (including the vulva and urethra) including: Thinning of the epithelium (skin/ mucosa surface) Can contribute to urinary urgency and frequency Can contribute to dryness, itching, irritation of the vulvar and vaginal tissue Can contribute to pain with intercourse Can contribute to physical changes of the labia, vulva, and vagina such as: Narrowing of the vaginal opening Decreased vaginal length Loss of labial architecture Labial adhesions Pale color of vulvovaginal tissue Loss of pubic hair Allows bacteria to become adherent  Results in increased risk for urinary tract infection (UTI) due to bacterial overgrowth and migration up the urethra into the bladder Change in vaginal pH (acid/ base balance) Allows for alteration / disruption of the normal bacterial flora / microbiome Results in increased risk for urinary tract infection (UTI) due to bacterial overgrowth  Treatment options: Over-the-counter lubricants (see list below). Prescription vaginal estrogen replacement. Options: Topical vaginal estrogen cream Estrace, Premarin, or compounded estradiol cream/ gel We advise: Discard plastic applicator as that tends to use more medication than you need, which is not harmful but wastes / uses up the medication. Also the plastic applicator may cause discomfort. Insert blueberry size amount of medication via the tip of your finger inside vagina  nightly for 1 week then 2-3 times per week (long term). Estring vaginal ring Exchanged every 3 months (either at home or in office by provider) Vagifem vaginal tablet Inserted nightly for 2 weeks then twice a week (long term) lntrarosa vaginal suppository Vaginal DHEA: converts to estrogen in vaginal tissue without systemic effect Inserted nightly (long term) Vaginal laser therapy (Mona Lisa touch) Performed in 3 treatments each 6 weeks apart (available in our [place] office). Can feel like a sunburn for 3-4 days after each treatment until new skin heals in. Usually not covered by insurance. Estimated cost is $1500 for all 3 sessions.  FYI regarding prescription vaginal estrogen treatment options: All topical vaginal estrogen replacement options are equivalent in terms of efficacy. Topical vaginal estrogen replacement will take about 3 months to be effective. OK to have sex with any of the topical vaginal estrogen replacement options. Topical vaginal estrogen replacement may sting/burn initially due to severe dryness, which will improve with ongoing treatment. There have been studies that evaluate use of low-dose intravaginal estrogen that show minimal systemic absorption which is negligible after 3 weeks. There have been no studies indicating increased risk of contributing to cancer development or recurrence.  Topical vaginal estrogen cream safe to use with breast cancer history [email or web address]  Topical vaginal estrogen cream safe to use with blood clot history [email or web address]   Lubricants and Moisturizers for Treating Genitourinary Syndrome of Menopause and Vulvovaginal Atrophy Treatment Comments I Available Products   Lubricants    Water-based Ingredients: Deionized water, glycerin, propylene glycol; latex safe; rare irritation; dry out with extended sexual activity Astroglide, Good Clean Love, K-Y Jelly, Natural, Organic, Pink, Sliquid, Sylk, Yes    Oil Based Ingredients: avocado, olive, peanut, corn; latex safe; can be used with silicone products; staining; safe (unless peanut allergy); non-irritating Coconut  oil, vegetable oil, vitamin E oil  Silicone-Based Ingredients: Silicone polymers; staining; typically nonirritating, long lasting; waterproof; should not be used with silicone dilators, sexual toys, or gynecologic products Astroglide X, Oceanus Ultra Pure, Pink Silicone, Pjur Eros, Replens Silky Smooth, Silicone Premium JO, SKYN, Uberlube, [employer] Based Minimize harm to sperm motility; designed Astroglide TTC, Conceive Plus, Pre for couples trying to conceive Seed, Yes Baby  Fertility Friendly Minimize harm to sperm motility; designed Astroglide, TTC, Conceive Plus, Pre for couples trying to conceive Seed, Yes Baby  Vaginal Moisturizers   Vaginal Moisturizers For maintenance use 1 to 3 times weekly; can benefit women with dryness, chafing with AOL, and recurrent vaginal infections irrespective of sexual activity timing Balance Active Menopause Vaginal Moisturizing Lubricant, Canesintima Intimate Moisturizer, Replens, Rephresh, Sylk Natural Intimate Moisturizer, Yes Vaginal Moisturizer  Hybrids Properties of both water and silicone-based products (combination of a vaginal lubricant and moisturizer); Non-irritating; good option for women with allergies and sensitivities Lubrigyn, Luvena  Suppositories Hyaluronic acid to retain moisture Revaree  Vulvar Soothing Creams/Oils    Medicated CreamsP ain and burn relief; Ingredients: 4% Lidocaine, Aloe Vera gel Releveum (Desert Wheaton)  Non-Medicated Creams For anti-itch and moisture/maintenance; Ingredients: Coconut oil, Avocado oil, Shea Butter,  Olive oil, Vitamin E Vajuvenate, Vmagic  Oils !For moisture/maintenance !Coconut oil, Vitamin E oil, Emu oil

## 2023-07-28 DIAGNOSIS — D333 Benign neoplasm of cranial nerves: Secondary | ICD-10-CM | POA: Diagnosis not present

## 2023-07-28 DIAGNOSIS — Z9889 Other specified postprocedural states: Secondary | ICD-10-CM | POA: Diagnosis not present

## 2023-08-02 DIAGNOSIS — E785 Hyperlipidemia, unspecified: Secondary | ICD-10-CM | POA: Diagnosis not present

## 2023-08-02 DIAGNOSIS — Z79899 Other long term (current) drug therapy: Secondary | ICD-10-CM | POA: Diagnosis not present

## 2023-08-02 DIAGNOSIS — Z87440 Personal history of urinary (tract) infections: Secondary | ICD-10-CM | POA: Diagnosis not present

## 2023-08-09 DIAGNOSIS — M48062 Spinal stenosis, lumbar region with neurogenic claudication: Secondary | ICD-10-CM | POA: Diagnosis not present

## 2023-08-09 DIAGNOSIS — E785 Hyperlipidemia, unspecified: Secondary | ICD-10-CM | POA: Diagnosis not present

## 2023-08-09 DIAGNOSIS — I2584 Coronary atherosclerosis due to calcified coronary lesion: Secondary | ICD-10-CM | POA: Diagnosis not present

## 2023-08-11 DIAGNOSIS — H903 Sensorineural hearing loss, bilateral: Secondary | ICD-10-CM | POA: Diagnosis not present

## 2023-08-27 ENCOUNTER — Encounter: Payer: Self-pay | Admitting: Internal Medicine

## 2023-08-27 ENCOUNTER — Ambulatory Visit: Payer: PPO | Attending: Internal Medicine | Admitting: Internal Medicine

## 2023-08-27 VITALS — BP 140/70 | HR 75 | Ht 65.0 in | Wt 148.0 lb

## 2023-08-27 DIAGNOSIS — R079 Chest pain, unspecified: Secondary | ICD-10-CM

## 2023-08-27 DIAGNOSIS — I10 Essential (primary) hypertension: Secondary | ICD-10-CM

## 2023-08-27 DIAGNOSIS — E785 Hyperlipidemia, unspecified: Secondary | ICD-10-CM | POA: Diagnosis not present

## 2023-08-27 DIAGNOSIS — I444 Left anterior fascicular block: Secondary | ICD-10-CM | POA: Diagnosis not present

## 2023-08-27 DIAGNOSIS — R931 Abnormal findings on diagnostic imaging of heart and coronary circulation: Secondary | ICD-10-CM | POA: Diagnosis not present

## 2023-08-27 DIAGNOSIS — I493 Ventricular premature depolarization: Secondary | ICD-10-CM

## 2023-08-27 NOTE — Patient Instructions (Signed)
Medication Instructions:  Your physician recommends that you continue on your current medications as directed. Please refer to the Current Medication list given to you today.  *If you need a refill on your cardiac medications before your next appointment, please call your pharmacy*   Lab Work: Lipid Panel (FASTING) before you have the stress test.   Testing/Procedures: Your physician has requested that you have a lexiscan myoview. For further information please visit [email or web address] Please follow instruction sheet, as given. This will take place at [address], [address]  How to prepare for your Myocardial Perfusion Test: Do not eat or drink 3 hours prior to your test, except you may have water. Do not consume products containing caffeine (regular or decaffeinated) 12 hours prior to your test. (ex: coffee, chocolate, sodas, tea). Do bring a list of your current medications with you.  If not listed below, you may take your medications as normal. Do wear comfortable clothes (no dresses or overalls) and walking shoes, tennis shoes preferred (No heels or open toe shoes are allowed). Do NOT wear cologne, perfume, aftershave, or lotions (deodorant is allowed). The test will take approximately 3 to 4 hours to complete If these instructions are not followed, your test will have to be rescheduled.    Follow-Up: At [facility], you and your health needs are our priority.  As part of our continuing mission to provide you with exceptional heart care, we have created designated Provider Care Teams.  These Care Teams include your primary Cardiologist (physician) and Advanced Practice Providers (APPs -  Physician Assistants and Nurse Practitioners) who all work together to provide you with the care you need, when you need it.   Your next appointment:   3 month(s)  Provider:   Chrystie Nose, MD

## 2023-08-27 NOTE — Progress Notes (Signed)
 OFFICE CONSULT NOTE  Chief Complaint:  Chest pain  Primary Care Physician: Carylon Perches, MD  HPI:  [name] is a 83 y.o. female who is being seen today for the evaluation of chest pain at the request of Carylon Perches, MD. This is a pleasant 83 year old female patient of Dr. Ouida Sills, kindly referred for evaluation management of chest pain.  She underwent a screening calcium score in November 2024 which was 219.7, 62nd percentile for age and sex matched controls.  In addition there is aortic atherosclerosis.  Cardiovascular risk factors include dyslipidemia and hypertension.  Family history is unknown since she was adopted.  She apparently has been having some chest discomfort although says that it is somewhat minimal.  It generally occurs at rest and not necessarily with exertion or worse with activities.  She was started recently on rosuvastatin.  Her last lipid profile in January showed total cholesterol 136, HDL 41, triglycerides 210 and LDL 53.  PMHx:  Past Medical History:  Diagnosis Date   Abnormality of gait 02/19/2016   Anxiety    Benign positional vertigo    Broken foot    left leg and toe   Bronchitis    Charcot Marie Tooth muscular atrophy    Hemoglobin low    and hemocrit   Hyperlipidemia    Hypertension    Iron deficiency anemia    Osteoporosis    Peripheral neuropathy    Pneumonia    Prepyloric ulcer    RLQ abdominal pain 11/24/2013   Hx of ovarian remnant ,now has pain down right leg  Will get [place]   Shingles    Right V1 distribution    Past Surgical History:  Procedure Laterality Date   ABDOMINAL HYSTERECTOMY  1979   BILATERAL OOPHORECTOMY     1996   BIOPSY  04/22/2023   Procedure: BIOPSY;  Surgeon: Corbin Ade, MD;  [facility]: [facility];  Service: Endoscopy;;   brain tumor     BREAST EXCISIONAL BIOPSY Left 2016   benign x 2   BREAST LUMPECTOMY WITH RADIOACTIVE SEED LOCALIZATION Left 04/02/2015   Procedure: BREAST LUMPECTOMY WITH RADIOACTIVE  SEED LOCALIZATION;  Surgeon: Claud Kelp, MD;  Location: [facility];  Service: General;  Laterality: Left;   CATARACT EXTRACTION Bilateral    CHOLECYSTECTOMY     COLONOSCOPY     cameria capsule   COLONOSCOPY N/A 06/22/2013   Procedure: COLONOSCOPY;  Surgeon: Corbin Ade, MD;  Location: [facility];  Service: Endoscopy;  Laterality: N/A;  1:00 PM   COLONOSCOPY WITH PROPOFOL N/A 04/22/2023   Procedure: COLONOSCOPY WITH PROPOFOL;  Surgeon: Corbin Ade, MD;  Location: [facility];  Service: Endoscopy;  Laterality: N/A;  1230pm, asa 2   FOOT SURGERY  06/11/06   rt.     MASTOIDECTOMY     POLYPECTOMY  04/22/2023   Procedure: POLYPECTOMY;  Surgeon: Corbin Ade, MD;  Location: [facility];  Service: Endoscopy;;   retroperitoneal cyst     drainage of   TOTAL ABDOMINAL HYSTERECTOMY W/ BILATERAL SALPINGOOPHORECTOMY  1996    FAMHx:  Family History  Adopted: Yes    SOCHx:   reports that she has never smoked. She has never used smokeless tobacco. She reports that she does not drink alcohol and does not use drugs.  ALLERGIES:  Allergies  Allergen Reactions   Prednisone    Ciprofloxacin Hives and Rash   Sulfonamide Derivatives Hives and Rash    ROS:  Pertinent items noted in HPI and remainder of comprehensive ROS otherwise negative.  HOME MEDS: Current Outpatient Medications on File Prior to Visit  Medication Sig Dispense Refill   acetaminophen (TYLENOL) 500 MG tablet Take by mouth every 6 (six) hours as needed for pain.     alendronate (FOSAMAX) 70 MG tablet Take 70 mg by mouth once a week.     amLODipine (NORVASC) 5 MG tablet Take 5 mg by mouth daily.     clonazePAM (KLONOPIN) 0.5 MG tablet Take 0.5 mg by mouth daily as needed for anxiety.     estradiol (ESTRACE) 0.1 MG/GM vaginal cream Discard plastic applicator. Insert a blueberry size amount (approximately 1 gram) of cream on fingertip inside vagina at bedtime every night for 1 week then every other  night. For long term use. 30 g 3   omeprazole (PRILOSEC) 20 MG capsule Take 1 capsule (20 mg total) by mouth daily. 90 capsule 3   pregabalin (LYRICA) 150 MG capsule Take 1 capsule (150 mg total) by mouth 3 (three) times daily. 90 capsule 5   zolpidem (AMBIEN) 5 MG tablet Take 5 mg by mouth at bedtime as needed.     dicyclomine (BENTYL) 20 MG tablet Take 1 tablet (20 mg total) by mouth 4 (four) times daily -  before meals and at bedtime. 120 tablet 0   rosuvastatin (CRESTOR) 20 MG tablet SMARTSIG:1 Tablet(s) By Mouth Every Evening (Patient not taking: Reported on 08/27/2023)     Sod Picosulfate-Mag Ox-Cit Acd (CLENPIQ) 10-3.5-12 MG-GM -GM/175ML SOLN Take 1 kit by mouth as directed. (Patient not taking: Reported on 08/27/2023) 350 mL 0   No current facility-administered medications on file prior to visit.    LABS/IMAGING: No results found for this or any previous visit (from the past 48 hours). No results found.  LIPID PANEL:    Component Value Date/Time   CHOL 237 (H) 01/22/2011 0903   TRIG 307 (H) 01/22/2011 0903   HDL 53 01/22/2011 0903   CHOLHDL 4.5 01/22/2011 0903   VLDL 61 (H) 01/22/2011 0903   LDLCALC 123 (H) 01/22/2011 0903    WEIGHTS: Wt Readings from Last 3 Encounters:  08/27/23 148 lb (67.1 kg)  04/22/23 150 lb (68 kg)  04/02/23 145 lb 9.6 oz (66 kg)    VITALS: BP (!) 140/70 (BP Location: Left Arm, Patient Position: Sitting, Cuff Size: Normal)   Pulse 75   Ht 5\' 5"  (1.651 m)   Wt 148 lb (67.1 kg)   SpO2 98%   BMI 24.63 kg/m   EXAM: General appearance: alert and no distress Neck: no carotid bruit, no JVD, and thyroid not enlarged, symmetric, no tenderness/mass/nodules Lungs: clear to auscultation bilaterally Heart: regular rate and rhythm, S1, S2 normal, no murmur, click, rub or gallop Abdomen: soft, non-tender; bowel sounds normal; no masses,  no organomegaly Extremities: extremities normal, atraumatic, no cyanosis or edema Pulses: 2+ and symmetric Skin: Skin  color, texture, turgor normal. No rashes or lesions Neurologic: Grossly normal Psych: Pleasant  EKG: EKG Interpretation Date/Time:  Friday August 27 2023 11:45:49 EST Ventricular Rate:  75 PR Interval:  200 QRS Duration:  74 QT Interval:  372 QTC Calculation: 415 R Axis:   -48  Text Interpretation: Sinus rhythm with occasional Premature ventricular complexes Left anterior fascicular block When compared with ECG of 21-Jun-2023 12:06, Premature ventricular complexes are now Present Confirmed by Zoila Shutter [phone]) on 08/27/2023 11:55:18 AM    ASSESSMENT: Chest pain of uncertain etiology Elevated CAC score of 119.7,  62th percentile (05/2023) Aortic atherosclerosis Hypertension Dyslipidemia, goal LDL less than 70 Abnormal EKG-LAFB/PVCs  PLAN: 1.   [name] is describing a chest pain of uncertain etiology.  It seems to be more common at rest or random rather than necessarily exertional.  She does have an elevated calcium score and aortic atherosclerosis and therefore it is reasonable to consider an ischemia evaluation.  Her EKG was also abnormal showing a left anterior fascicular block and PVCs.  I would recommend a Lexiscan Myoview.  I will contact her with those results.  Plan otherwise follow-up with me in 3 months or sooner as necessary.  Chrystie Nose, MD, [facility], FACP  [place]  [facility]  Medical Director of the Advanced Lipid Disorders &  Cardiovascular Risk Reduction Clinic Diplomate of the American Board of Clinical Lipidology Attending Cardiologist  Direct Dial: [phone]  Fax: [phone]  Website:  [email or web address] Nicely [name] 08/27/2023, 11:55 AM

## 2023-08-29 NOTE — Addendum Note (Signed)
 Addended by: Chrystie Nose on: 08/29/2023 04:18 PM   Modules accepted: Level of Service

## 2023-08-30 DIAGNOSIS — R079 Chest pain, unspecified: Secondary | ICD-10-CM | POA: Diagnosis not present

## 2023-08-31 DIAGNOSIS — H903 Sensorineural hearing loss, bilateral: Secondary | ICD-10-CM | POA: Diagnosis not present

## 2023-08-31 LAB — LIPID PANEL
Chol/HDL Ratio: 4.9 {ratio} — ABNORMAL HIGH (ref 0.0–4.4)
Cholesterol, Total: 250 mg/dL — ABNORMAL HIGH (ref 100–199)
HDL: 51 mg/dL (ref >39)
LDL Chol Calc (NIH): 161 mg/dL — ABNORMAL HIGH (ref 0–99)
Triglycerides: 207 mg/dL — ABNORMAL HIGH (ref 0–149)
VLDL Cholesterol Cal: 38 mg/dL (ref 5–40)

## 2023-09-01 ENCOUNTER — Encounter (HOSPITAL_COMMUNITY): Payer: Self-pay

## 2023-09-06 ENCOUNTER — Ambulatory Visit (HOSPITAL_COMMUNITY): Payer: PPO | Attending: Internal Medicine

## 2023-09-06 DIAGNOSIS — R079 Chest pain, unspecified: Secondary | ICD-10-CM | POA: Insufficient documentation

## 2023-09-06 LAB — MYOCARDIAL PERFUSION IMAGING
Estimated workload: 1
Exercise duration (min): 1 min
Exercise duration (sec): 0 s
LV dias vol: 55 mL (ref 46–106)
LV sys vol: 14 mL
MPHR: 138 {beats}/min
Nuc Stress EF: 74 %
Peak HR: 100 {beats}/min
Percent HR: 72 %
Rest HR: 74 {beats}/min
Rest Nuclear Isotope Dose: 10.3 mCi
SDS: 1
SRS: 0
SSS: 1
ST Depression (mm): 0 mm
Stress Nuclear Isotope Dose: 32.4 mCi
TID: 1.08

## 2023-09-06 MED ORDER — REGADENOSON 0.4 MG/5ML IV SOLN
0.4000 mg | Freq: Once | INTRAVENOUS | Status: AC
  Administered 2023-09-06: 0.4 mg via INTRAVENOUS

## 2023-09-06 MED ORDER — TECHNETIUM TC 99M TETROFOSMIN IV KIT
32.4000 | PACK | Freq: Once | INTRAVENOUS | Status: AC | PRN
  Administered 2023-09-06: 32.4 via INTRAVENOUS

## 2023-09-06 MED ORDER — TECHNETIUM TC 99M TETROFOSMIN IV KIT
10.3000 | PACK | Freq: Once | INTRAVENOUS | Status: AC | PRN
  Administered 2023-09-06: 10.3 via INTRAVENOUS

## 2023-09-08 ENCOUNTER — Encounter: Payer: Self-pay | Admitting: Internal Medicine

## 2023-09-30 DIAGNOSIS — E785 Hyperlipidemia, unspecified: Secondary | ICD-10-CM | POA: Diagnosis not present

## 2023-09-30 DIAGNOSIS — Z79899 Other long term (current) drug therapy: Secondary | ICD-10-CM | POA: Diagnosis not present

## 2023-10-08 DIAGNOSIS — E785 Hyperlipidemia, unspecified: Secondary | ICD-10-CM | POA: Diagnosis not present

## 2023-10-08 DIAGNOSIS — J189 Pneumonia, unspecified organism: Secondary | ICD-10-CM | POA: Diagnosis not present

## 2023-10-26 ENCOUNTER — Ambulatory Visit: Payer: PPO | Admitting: Urology

## 2023-11-08 DIAGNOSIS — M48062 Spinal stenosis, lumbar region with neurogenic claudication: Secondary | ICD-10-CM | POA: Diagnosis not present

## 2023-11-19 ENCOUNTER — Other Ambulatory Visit: Payer: Self-pay | Admitting: Gastroenterology

## 2023-11-19 DIAGNOSIS — K219 Gastro-esophageal reflux disease without esophagitis: Secondary | ICD-10-CM

## 2023-11-22 DIAGNOSIS — Z79899 Other long term (current) drug therapy: Secondary | ICD-10-CM | POA: Diagnosis not present

## 2023-11-22 DIAGNOSIS — E785 Hyperlipidemia, unspecified: Secondary | ICD-10-CM | POA: Diagnosis not present

## 2023-11-26 ENCOUNTER — Other Ambulatory Visit (HOSPITAL_COMMUNITY): Payer: Self-pay

## 2023-11-26 ENCOUNTER — Ambulatory Visit: Payer: PPO | Attending: Internal Medicine | Admitting: Internal Medicine

## 2023-11-26 ENCOUNTER — Encounter: Payer: Self-pay | Admitting: Internal Medicine

## 2023-11-26 ENCOUNTER — Telehealth: Payer: Self-pay | Admitting: Pharmacy Technician

## 2023-11-26 VITALS — BP 138/68 | HR 71 | Ht 65.0 in | Wt 147.8 lb

## 2023-11-26 DIAGNOSIS — R931 Abnormal findings on diagnostic imaging of heart and coronary circulation: Secondary | ICD-10-CM

## 2023-11-26 DIAGNOSIS — I493 Ventricular premature depolarization: Secondary | ICD-10-CM

## 2023-11-26 DIAGNOSIS — E785 Hyperlipidemia, unspecified: Secondary | ICD-10-CM | POA: Diagnosis not present

## 2023-11-26 DIAGNOSIS — Z789 Other specified health status: Secondary | ICD-10-CM

## 2023-11-26 NOTE — Patient Instructions (Signed)
 Medication Instructions:  Dr. Maximo Spar recommends Repatha (PCSK9). This is an injectable cholesterol medication self-administered once every 14 days. This medication will likely need prior approval with your insurance company, which we will work on. If the medication is not approved initially, we may need to do an appeal with your insurance.   Administer medication in area of fatty tissue such as abdomen, outer thigh, back of upper arm - and rotate site with each injection Store medication in refrigerator until ready to administer - allow to sit at room temp for 30 mins - 1 hour prior to injection Dispose of medication in a SHARPS container - your pharmacy should be able to direct you on this and proper disposal   If you need a co-pay card for Repatha: Lawsponsor.fr If you need a co-pay card for Praluent: [email or web address]  Patient Assistance:    These foundations have funds at various times.   The PAN Foundation: https://www.panfoundation.org/disease-funds/hypercholesterolemia/ -- can sign up for wait list  The [facility] offers assistance to help pay for medication copays.  They will cover copays for all cholesterol lowering meds, including statins, fibrates, omega-3 fish oils like Vascepa, ezetimibe, Repatha, Praluent, Nexletol, Nexlizet.  The cards are usually good for $2,500 or 12 months, whichever comes first. Our fax # is [phone] (you will need this to apply) Go to healthwellfoundation.org Click on "Apply Now" Answer questions as to whom is applying (patient or representative) Your disease fund will be "hypercholesterolemia - Medicare access" They will ask questions about finances and which medications you are taking for cholesterol When you submit, the approval is usually within minutes.  You will need to print the card information from the site You will need to show this information to your pharmacy, they will bill your  Medicare Part D plan first -then bill Health Well --for the copay.   You can also call them at [phone], although the hold times can be quite long.    *If you need a refill on your cardiac medications before your next appointment, please call your pharmacy*  Lab Work: Fasting lipids in 6 months (before follow up appt) If you have labs (blood work) drawn today and your tests are completely normal, you will receive your results only by: MyChart Message (if you have MyChart) OR A paper copy in the mail If you have any lab test that is abnormal or we need to change your treatment, we will call you to review the results.  Follow-Up: At [facility], you and your health needs are our priority.  As part of our continuing mission to provide you with exceptional heart care, our providers are all part of one team.  This team includes your primary Cardiologist (physician) and Advanced Practice Providers or APPs (Physician Assistants and Nurse Practitioners) who all work together to provide you with the care you need, when you need it.  Your next appointment:   In 6 months with Slater Duncan NP - Lipid clinic at [facility]

## 2023-11-26 NOTE — Telephone Encounter (Signed)
 Pharmacy Patient Advocate Encounter   Received notification from Physician's Office that prior authorization for Repatha is required/requested.   Insurance verification completed.   The patient is insured through [facility] ADVANTAGE/RX ADVANCE .   Per test claim: PA required; PA submitted to above mentioned insurance via CoverMyMeds Key/confirmation #/EOC [id number] Status is pending

## 2023-11-26 NOTE — Progress Notes (Signed)
 OFFICE CONSULT NOTE  Chief Complaint:  No complaints  Primary Care Physician: Artemisa Bile, MD  HPI:  [name] is a 83 y.o. female who is being seen today for the evaluation of chest pain at the request of Artemisa Bile, MD. This is a pleasant 83 year old female patient of Dr. Hildy Lowers, kindly referred for evaluation management of chest pain.  She underwent a screening calcium score in November 2024 which was 219.7, 62nd percentile for age and sex matched controls.  In addition there is aortic atherosclerosis.  Cardiovascular risk factors include dyslipidemia and hypertension.  Family history is unknown since she was adopted.  She apparently has been having some chest discomfort although says that it is somewhat minimal.  It generally occurs at rest and not necessarily with exertion or worse with activities.  She was started recently on rosuvastatin.  Her last lipid profile in January showed total cholesterol 136, HDL 41, triglycerides 210 and LDL 53.  11/26/2023  [name] is seen today in follow-up.  She reports her chest pressure has improved.  She did undergo a Myoview  stress test in February which was negative for ischemia.  She was noted however to have coronary calcium as above.  Her lipids are still poorly controlled with LDL of 161 in February.  She was trialed on rosuvastatin but she said she had side effects with it, primarily noting fatigue.  This was similar to atorvastatin in the past.  We discussed today about alternatives to lipid-lowering therapies including PCSK9 inhibitors which might be an option.  She notes that her husband is also on a PCSK9 inhibitor and seems to do well with it.  PMHx:  Past Medical History:  Diagnosis Date   Abnormality of gait 02/19/2016   Anxiety    Benign positional vertigo    Broken foot    left leg and toe   Bronchitis    Charcot Marie Tooth muscular atrophy    Hemoglobin low    and hemocrit   Hyperlipidemia    Hypertension    Iron  deficiency anemia    Osteoporosis    Peripheral neuropathy    Pneumonia    Prepyloric ulcer    RLQ abdominal pain 11/24/2013   Hx of ovarian remnant ,now has pain down right leg  Will get US    Shingles    Right V1 distribution    Past Surgical History:  Procedure Laterality Date   ABDOMINAL HYSTERECTOMY  1979   BILATERAL OOPHORECTOMY     1996   BIOPSY  04/22/2023   Procedure: BIOPSY;  Surgeon: Suzette Espy, MD;  [facility];  Service: Endoscopy;;   brain tumor     BREAST EXCISIONAL BIOPSY Left 2016   benign x 2   BREAST LUMPECTOMY WITH RADIOACTIVE SEED LOCALIZATION Left 04/02/2015   Procedure: BREAST LUMPECTOMY WITH RADIOACTIVE SEED LOCALIZATION;  Surgeon: Boyce Byes, MD;  Location: [facility];  Service: General;  Laterality: Left;   CATARACT EXTRACTION Bilateral    CHOLECYSTECTOMY     COLONOSCOPY     cameria capsule   COLONOSCOPY N/A 06/22/2013   Procedure: COLONOSCOPY;  Surgeon: Suzette Espy, MD;  [facility]: [facility];  Service: Endoscopy;  Laterality: N/A;  1:00 PM   COLONOSCOPY WITH PROPOFOL  N/A 04/22/2023   Procedure: COLONOSCOPY WITH PROPOFOL ;  Surgeon: Suzette Espy, MD;  [facility]: [facility];  Service: Endoscopy;  Laterality: N/A;  1230pm, asa 2   FOOT SURGERY  06/11/06  rt.     MASTOIDECTOMY     POLYPECTOMY  04/22/2023   Procedure: POLYPECTOMY;  Surgeon: Suzette Espy, MD;  Location: [facility];  Service: Endoscopy;;   retroperitoneal cyst     drainage of   TOTAL ABDOMINAL HYSTERECTOMY W/ BILATERAL SALPINGOOPHORECTOMY  1996    FAMHx:  Family History  Adopted: Yes    SOCHx:   reports that she has never smoked. She has never used smokeless tobacco. She reports that she does not drink alcohol and does not use drugs.  ALLERGIES:  Allergies  Allergen Reactions   Prednisone    Ciprofloxacin Hives and Rash   Sulfonamide Derivatives Hives and Rash    ROS: Pertinent items noted in HPI and remainder of  comprehensive ROS otherwise negative.  HOME MEDS: Current Outpatient Medications on File Prior to Visit  Medication Sig Dispense Refill   acetaminophen  (TYLENOL ) 500 MG tablet Take by mouth every 6 (six) hours as needed for pain.     alendronate (FOSAMAX) 70 MG tablet Take 70 mg by mouth once a week.     clonazePAM (KLONOPIN) 0.5 MG tablet Take 0.5 mg by mouth daily as needed for anxiety.     estradiol  (ESTRACE ) 0.1 MG/GM vaginal cream Discard plastic applicator. Insert a blueberry size amount (approximately 1 gram) of cream on fingertip inside vagina at bedtime every night for 1 week then every other night. For long term use. 30 g 3   omeprazole  (PRILOSEC) 20 MG capsule TAKE 1 CAPSULE BY MOUTH ONCE A DAY 90 capsule 3   pregabalin  (LYRICA ) 150 MG capsule Take 1 capsule (150 mg total) by mouth 3 (three) times daily. 90 capsule 5   Sod Picosulfate-Mag Ox-Cit Acd (CLENPIQ ) 10-3.5-12 MG-GM -GM/175ML SOLN Take 1 kit by mouth as directed. (Patient not taking: Reported on 08/27/2023) 350 mL 0   zolpidem (AMBIEN) 5 MG tablet Take 5 mg by mouth at bedtime as needed.     No current facility-administered medications on file prior to visit.    LABS/IMAGING: No results found for this or any previous visit (from the past 48 hours). No results found.  LIPID PANEL:    Component Value Date/Time   CHOL 250 (H) 08/30/2023 0812   TRIG 207 (H) 08/30/2023 0812   HDL 51 08/30/2023 0812   CHOLHDL 4.9 (H) 08/30/2023 0812   CHOLHDL 4.5 01/22/2011 0903   VLDL 61 (H) 01/22/2011 0903   LDLCALC 161 (H) 08/30/2023 0812    WEIGHTS: Wt Readings from Last 3 Encounters:  11/26/23 147 lb 12.8 oz (67 kg)  09/06/23 148 lb (67.1 kg)  08/27/23 148 lb (67.1 kg)    VITALS: BP 138/68 (BP Location: Left Arm, Patient Position: Sitting, Cuff Size: Normal)   Pulse 71   Ht 5\' 5"  (1.651 m)   Wt 147 lb 12.8 oz (67 kg)   SpO2 97%   BMI 24.60 kg/m   EXAM: General appearance: alert and no distress Neck: no carotid  bruit, no JVD, and thyroid  not enlarged, symmetric, no tenderness/mass/nodules Lungs: clear to auscultation bilaterally Heart: regular rate and rhythm, S1, S2 normal, no murmur, click, rub or gallop Abdomen: soft, non-tender; bowel sounds normal; no masses,  no organomegaly Extremities: extremities normal, atraumatic, no cyanosis or edema Pulses: 2+ and symmetric Skin: Skin color, texture, turgor normal. No rashes or lesions Neurologic: Grossly normal Psych: Pleasant  EKG: Deferred  ASSESSMENT: Chest pain of uncertain etiology-low risk Myoview  stress test (08/2023) Elevated CAC score of 119.7, 62th percentile (05/2023) Aortic  atherosclerosis Hypertension Dyslipidemia, goal LDL less than 70 Abnormal EKG-LAFB/PVCs Statin intolerance - fatigue  PLAN: 1.   [name] had a low risk Myoview .  She is intolerant of statins due to fatigue.  LDL target is less than 70 due to aortic atherosclerosis and coronary artery calcification which is age advanced however she cannot tolerate statins and therefore needs more than 50% reduction in LDL to reach goal.  Based on that she is a good candidate for PCSK9 inhibitor.  Will reach out for prior authorization for Repatha and plan repeat lipids in about 3-4 months and follow-up with [name] at that time.  Hazle Lites, MD, [facility], FNLA, FACP  [place]  [facility]  Medical Director of the [facility] Lipid Disorders &  Cardiovascular Risk Reduction Clinic Diplomate of the American Board of Clinical Lipidology Attending Cardiologist  Direct Dial: [phone]  Fax: [phone]  Website:  [email or web address] Carianna Lague 11/26/2023, 11:08 AM

## 2023-11-26 NOTE — Telephone Encounter (Signed)
-----   Message from Nurse Jocabed C sent at 11/26/2023 11:35 AM EDT ----- Regarding: Repatha PA Good morning ! This patient will need a PA for Repatha. Thank you.

## 2023-11-26 NOTE — Telephone Encounter (Signed)
 Pharmacy Patient Advocate Encounter  Received notification from [employer] that Prior Authorization for Repatha has been APPROVED from 11/26/23 to 05/24/24. Ran test claim, Copay is $47.00- one month. This test claim was processed through [facility]- copay amounts may vary at other pharmacies due to pharmacy/plan contracts, or as the patient moves through the different stages of their insurance plan.   PA #/Case ID/Reference #: [id number]

## 2023-11-29 DIAGNOSIS — G629 Polyneuropathy, unspecified: Secondary | ICD-10-CM | POA: Diagnosis not present

## 2023-11-29 DIAGNOSIS — E785 Hyperlipidemia, unspecified: Secondary | ICD-10-CM | POA: Diagnosis not present

## 2023-12-03 ENCOUNTER — Telehealth: Payer: Self-pay | Admitting: Internal Medicine

## 2023-12-03 DIAGNOSIS — R931 Abnormal findings on diagnostic imaging of heart and coronary circulation: Secondary | ICD-10-CM

## 2023-12-03 DIAGNOSIS — E785 Hyperlipidemia, unspecified: Secondary | ICD-10-CM

## 2023-12-03 MED ORDER — REPATHA SURECLICK 140 MG/ML ~~LOC~~ SOAJ
1.0000 mL | SUBCUTANEOUS | 1 refills | Status: DC
Start: 2023-12-03 — End: 2024-05-10

## 2023-12-03 NOTE — Telephone Encounter (Signed)
*  STAT* If patient is at the pharmacy, call can be transferred to refill team.   1. Which medications need to be refilled? (please list name of each medication and dose if known)   Repatha   2. Which pharmacy/location (including street and city if local pharmacy) is medication to be sent to? [facility] - [place], [place] - [address]  3. Do they need a 30 day or 90 day supply?  90 day supply

## 2023-12-07 NOTE — Telephone Encounter (Signed)
 [facility] Rep[facility] [place] [place] on 12/03/23

## 2024-01-18 DIAGNOSIS — J189 Pneumonia, unspecified organism: Secondary | ICD-10-CM | POA: Diagnosis not present

## 2024-01-18 DIAGNOSIS — M5136 Other intervertebral disc degeneration, lumbar region with discogenic back pain only: Secondary | ICD-10-CM | POA: Diagnosis not present

## 2024-01-23 ENCOUNTER — Emergency Department (HOSPITAL_COMMUNITY)
Admission: EM | Admit: 2024-01-23 | Discharge: 2024-01-23 | Disposition: A | Discharge status: Home / Self Care | Attending: Emergency Medicine | Admitting: Emergency Medicine

## 2024-01-23 ENCOUNTER — Emergency Department (HOSPITAL_COMMUNITY)

## 2024-01-23 ENCOUNTER — Other Ambulatory Visit: Payer: Self-pay

## 2024-01-23 ENCOUNTER — Encounter (HOSPITAL_COMMUNITY): Payer: Self-pay | Admitting: Emergency Medicine

## 2024-01-23 DIAGNOSIS — R197 Diarrhea, unspecified: Secondary | ICD-10-CM | POA: Diagnosis not present

## 2024-01-23 DIAGNOSIS — R110 Nausea: Secondary | ICD-10-CM | POA: Diagnosis not present

## 2024-01-23 DIAGNOSIS — K5730 Diverticulosis of large intestine without perforation or abscess without bleeding: Secondary | ICD-10-CM | POA: Diagnosis not present

## 2024-01-23 DIAGNOSIS — K6289 Other specified diseases of anus and rectum: Secondary | ICD-10-CM | POA: Diagnosis not present

## 2024-01-23 DIAGNOSIS — D72829 Elevated white blood cell count, unspecified: Secondary | ICD-10-CM | POA: Diagnosis not present

## 2024-01-23 DIAGNOSIS — R109 Unspecified abdominal pain: Secondary | ICD-10-CM | POA: Insufficient documentation

## 2024-01-23 DIAGNOSIS — K5900 Constipation, unspecified: Secondary | ICD-10-CM | POA: Diagnosis not present

## 2024-01-23 DIAGNOSIS — R935 Abnormal findings on diagnostic imaging of other abdominal regions, including retroperitoneum: Secondary | ICD-10-CM | POA: Diagnosis not present

## 2024-01-23 DIAGNOSIS — R000 Tachycardia, unspecified: Secondary | ICD-10-CM | POA: Diagnosis not present

## 2024-01-23 LAB — COMPREHENSIVE METABOLIC PANEL WITH GFR
ALT: 14 U/L (ref 0–44)
AST: 21 U/L (ref 15–41)
Albumin: 3.5 g/dL (ref 3.5–5.0)
Alkaline Phosphatase: 24 U/L — ABNORMAL LOW (ref 38–126)
Anion gap: 13 (ref 5–15)
BUN: 14 mg/dL (ref 8–23)
CO2: 24 mmol/L (ref 22–32)
Calcium: 8.9 mg/dL (ref 8.9–10.3)
Chloride: 98 mmol/L (ref 98–111)
Creatinine, Ser: 0.64 mg/dL (ref 0.44–1.00)
GFR, Estimated: >60 mL/min (ref >60)
Glucose, Bld: 124 mg/dL — ABNORMAL HIGH (ref 70–99)
Potassium: 3.4 mmol/L — ABNORMAL LOW (ref 3.5–5.1)
Sodium: 135 mmol/L (ref 135–145)
Total Bilirubin: 2.9 mg/dL — ABNORMAL HIGH (ref 0.0–1.2)
Total Protein: 6.3 g/dL — ABNORMAL LOW (ref 6.5–8.1)

## 2024-01-23 LAB — CBC WITH DIFFERENTIAL/PLATELET
Abs Immature Granulocytes: 0.1 K/uL — ABNORMAL HIGH (ref 0.00–0.07)
Basophils Absolute: 0.1 K/uL (ref 0.0–0.1)
Basophils Relative: 1 %
Eosinophils Absolute: 0.1 K/uL (ref 0.0–0.5)
Eosinophils Relative: 1 %
HCT: 40.4 % (ref 36.0–46.0)
Hemoglobin: 13.7 g/dL (ref 12.0–15.0)
Immature Granulocytes: 1 %
Lymphocytes Relative: 21 %
Lymphs Abs: 2.8 K/uL (ref 0.7–4.0)
MCH: 27.7 pg (ref 26.0–34.0)
MCHC: 33.9 g/dL (ref 30.0–36.0)
MCV: 81.6 fL (ref 80.0–100.0)
Monocytes Absolute: 1.4 K/uL — ABNORMAL HIGH (ref 0.1–1.0)
Monocytes Relative: 10 %
Neutro Abs: 8.8 K/uL — ABNORMAL HIGH (ref 1.7–7.7)
Neutrophils Relative %: 66 %
Platelets: 412 K/uL — ABNORMAL HIGH (ref 150–400)
RBC: 4.95 MIL/uL (ref 3.87–5.11)
RDW: 12.9 % (ref 11.5–15.5)
WBC: 13.3 K/uL — ABNORMAL HIGH (ref 4.0–10.5)
nRBC: 0 % (ref 0.0–0.2)

## 2024-01-23 LAB — TYPE AND SCREEN
ABO/RH(D): A NEG
Antibody Screen: NEGATIVE

## 2024-01-23 LAB — POC OCCULT BLOOD, ED: Fecal Occult Blood: POSITIVE — AB

## 2024-01-23 MED ORDER — HYDROCORTISONE ACETATE 25 MG RE SUPP
25.0000 mg | Freq: Two times a day (BID) | RECTAL | Status: DC
  Administered 2024-01-23: 25 mg via RECTAL
  Filled 2024-01-23: qty 1

## 2024-01-23 MED ORDER — DOXYCYCLINE HYCLATE 100 MG PO CAPS: 100.0000 mg | ORAL_CAPSULE | Freq: Two times a day (BID) | ORAL | 0 refills | Status: AC

## 2024-01-23 MED ORDER — MORPHINE SULFATE (PF) 4 MG/ML IV SOLN
4.0000 mg | Freq: Once | INTRAVENOUS | Status: AC
  Administered 2024-01-23: 4 mg via INTRAVENOUS
  Filled 2024-01-23: qty 1

## 2024-01-23 MED ORDER — FENTANYL CITRATE PF 50 MCG/ML IJ SOSY
50.0000 ug | PREFILLED_SYRINGE | Freq: Once | INTRAMUSCULAR | Status: AC
  Administered 2024-01-23: 50 ug via INTRAVENOUS
  Filled 2024-01-23: qty 1

## 2024-01-23 MED ORDER — METOCLOPRAMIDE HCL 5 MG/ML IJ SOLN
10.0000 mg | Freq: Once | INTRAMUSCULAR | Status: AC
  Administered 2024-01-23: 10 mg via INTRAVENOUS
  Filled 2024-01-23: qty 2

## 2024-01-23 MED ORDER — IOHEXOL 300 MG/ML  SOLN
100.0000 mL | Freq: Once | INTRAMUSCULAR | Status: AC | PRN
  Administered 2024-01-23: 100 mL via INTRAVENOUS

## 2024-01-23 MED ORDER — ONDANSETRON HCL 4 MG/2ML IJ SOLN
4.0000 mg | Freq: Once | INTRAMUSCULAR | Status: AC
  Administered 2024-01-23: 4 mg via INTRAVENOUS
  Filled 2024-01-23: qty 2

## 2024-01-23 MED ORDER — HYDROCORTISONE ACETATE 25 MG RE SUPP: 25.0000 mg | Freq: Two times a day (BID) | RECTAL | 0 refills | Status: AC

## 2024-01-23 MED ORDER — DOXYCYCLINE HYCLATE 100 MG PO TABS
100.0000 mg | ORAL_TABLET | Freq: Once | ORAL | Status: AC
  Administered 2024-01-23: 100 mg via ORAL
  Filled 2024-01-23: qty 1

## 2024-01-23 NOTE — ED Provider Notes (Signed)
 [place] EMERGENCY DEPARTMENT AT [facility] Provider Note   CSN: [id number] Arrival date & time: 01/23/24  1712     Patient presents with: Diarrhea   [name] is a 83 y.o. female.  Today for abdominal pain and diarrhea.  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  She took Ex-Lax and started having diarrhea last night, she states she filled up a toilet bowl with brown stool.  She is having right-sided abdominal pain now that she states goes all the way to her foot.  She denies fever or chills.  Denies dizziness.  She has nausea but no vomiting.    Diarrhea      Prior to Admission medications   Medication Sig Start Date End Date Taking? Authorizing Provider  acetaminophen  (TYLENOL ) 500 MG tablet Take by mouth every 6 (six) hours as needed for pain.    [provider]  alendronate (FOSAMAX) 70 MG tablet Take 70 mg by mouth once a week. 10/29/20   [provider]  clonazePAM (KLONOPIN) 0.5 MG tablet Take 0.5 mg by mouth daily as needed for anxiety.    [provider]  estradiol  (ESTRACE ) 0.1 MG/GM vaginal cream Discard plastic applicator. Insert a blueberry size amount (approximately 1 gram) of cream on fingertip inside vagina at bedtime every night for 1 week then every other night. For long term use. 07/27/23   Larocco, Sarah C, FNP  Evolocumab  (REPATHA  SURECLICK) 140 MG/ML SOAJ Inject 140 mg into the skin every 14 (fourteen) days. 12/03/23   Hilty, Vinie BROCKS, MD  omeprazole  (PRILOSEC) 20 MG capsule TAKE 1 CAPSULE BY MOUTH ONCE A DAY 11/22/23   Rourk, Lamar HERO, MD  pregabalin  (LYRICA ) 150 MG capsule Take 1 capsule (150 mg total) by mouth 3 (three) times daily. 08/30/14   Willis, Charles K, MD  zolpidem (AMBIEN) 5 MG tablet Take 5 mg by mouth at bedtime as needed. 11/14/19   [provider]    Allergies: Statins,  Prednisone, Ciprofloxacin, and Sulfonamide derivatives    Review of Systems  Gastrointestinal:  Positive for diarrhea.    Updated Vital Signs BP (!) 143/81   Pulse 80   Temp 97.9 F (36.6 C) (Oral)   Resp (!) 24   Ht 5' 5 (1.651 m)   Wt 67 kg   SpO2 97%   BMI 24.58 kg/m   Physical Exam Vitals and nursing note reviewed.  Constitutional:      General: She is not in acute distress.    Appearance: She is well-developed.  HENT:     Head: Normocephalic and atraumatic.     Mouth/Throat:     Mouth: Mucous membranes are moist.  Eyes:     Extraocular Movements: Extraocular movements intact.     Conjunctiva/sclera: Conjunctivae normal.     Pupils: Pupils are equal, round, and reactive to light.  Cardiovascular:     Rate and Rhythm: Normal rate and regular rhythm.     Heart sounds: No murmur heard. Pulmonary:     Effort: Pulmonary effort is normal. No respiratory distress.     Breath sounds: Normal breath sounds.  Abdominal:     Palpations: Abdomen is soft.     Tenderness: There is no abdominal tenderness.  Musculoskeletal:        General: No swelling.     Cervical back: Neck supple.  Skin:    General:  Skin is warm and dry.     Capillary Refill: Capillary refill takes less than 2 seconds.  Neurological:     General: No focal deficit present.     Mental Status: She is alert and oriented to person, place, and time.  Psychiatric:        Mood and Affect: Mood normal.     (all labs ordered are listed, but only abnormal results are displayed) Labs Reviewed  POC OCCULT BLOOD, ED - Abnormal; Notable for the following components:      Result Value   Fecal Occult Blood POSITIVE (*)    All other components within normal limits  COMPREHENSIVE METABOLIC PANEL WITH GFR  CBC WITH DIFFERENTIAL/PLATELET  TYPE AND SCREEN    EKG: None  Radiology: No results found.   Procedures   Medications Ordered in the ED - No data to display                                   Medical Decision Making Differential diagnose includes but limited to gastritis, colitis, GI bleed, other  Course: Patient having right-sided abdominal pain going down to the toes.  She had stool on her when she came in so RN did FOBT which was positive though stool was brown.  Hemoglobin is normal.  She does have mild leukocytosis with white blood cell count of 13.3.  She is afebrile, appears uncomfortable but in complaining primarily pain all over but it is worse on the right abdomen.  She is afebrile.  Given nausea medicine, she is pending.  Dr. Julie Idol, PA-C  Amount and/or Complexity of Data Reviewed External Data Reviewed: labs and notes. Radiology: ordered.  Risk Prescription drug management.        Final diagnoses:  None    ED Discharge Orders     None          Suellen Sherran DELENA DEVONNA 01/23/24 1914    Cleotilde Rogue, MD 01/24/24 2234

## 2024-01-23 NOTE — ED Triage Notes (Signed)
 Pt c/o diarrhea since last night, but has been constipated prior since taking prednisone.

## 2024-01-23 NOTE — ED Notes (Signed)
 Patient continues to scream in pain and continues to have liquid diarrhea every couple minutes.  Brief and pericare has been performed again.

## 2024-01-23 NOTE — Discharge Instructions (Signed)
 You are being prescribed a suppository for twice daily use for treatment of the pain symptoms associated with your proctitis.  You are also being placed on an antibiotic as this is frequently an infection causing your symptoms.  Take the entire course of the antibiotics.  Plan follow-up with Dr. Sheryle for recheck this week if your symptoms are not improving with this treatment plan.

## 2024-01-23 NOTE — ED Notes (Addendum)
 Patient continuing to scream and demand pain medication. Patient continues to have diarrhea.  Cleaned patient up and placed patient in a brief with chux. Patient screaming due to left foot hurt.  Reports it hurts worse when I am sick.

## 2024-01-23 NOTE — ED Provider Notes (Signed)
 Pt given to me at shift change from [facility].  Presenting with diarrhea after taking exlax for constipation last night.  Pain in RUQ to right toes after taking exlax.  Hemoccult +,  but normal hgb.  Pending CT abd/pelvis. Would plan dc home if CT negative for acute process.    Results for orders placed or performed during the hospital encounter of 01/23/24  POC occult blood, ED   Collection Time: 01/23/24  5:42 PM  Result Value Ref Range   Fecal Occult Blood POSITIVE (A)   Comprehensive metabolic panel   Collection Time: 01/23/24  6:10 PM  Result Value Ref Range   Sodium 135 135 - 145 mmol/L   Potassium 3.4 (L) 3.5 - 5.1 mmol/L   Chloride 98 98 - 111 mmol/L   CO2 24 22 - 32 mmol/L   Glucose, Bld 124 (H) 70 - 99 mg/dL   BUN 14 8 - 23 mg/dL   Creatinine, Ser 9.35 0.44 - 1.00 mg/dL   Calcium 8.9 8.9 - 89.6 mg/dL   Total Protein 6.3 (L) 6.5 - 8.1 g/dL   Albumin 3.5 3.5 - 5.0 g/dL   AST 21 15 - 41 U/L   ALT 14 0 - 44 U/L   Alkaline Phosphatase 24 (L) 38 - 126 U/L   Total Bilirubin 2.9 (H) 0.0 - 1.2 mg/dL   GFR, Estimated >39 >39 mL/min   Anion gap 13 5 - 15  CBC with Differential   Collection Time: 01/23/24  6:10 PM  Result Value Ref Range   WBC 13.3 (H) 4.0 - 10.5 K/uL   RBC 4.95 3.87 - 5.11 MIL/uL   Hemoglobin 13.7 12.0 - 15.0 g/dL   HCT 59.5 63.9 - 53.9 %   MCV 81.6 80.0 - 100.0 fL   MCH 27.7 26.0 - 34.0 pg   MCHC 33.9 30.0 - 36.0 g/dL   RDW 87.0 88.4 - 84.4 %   Platelets 412 (H) 150 - 400 K/uL   nRBC 0.0 0.0 - 0.2 %   Neutrophils Relative % 66 %   Neutro Abs 8.8 (H) 1.7 - 7.7 K/uL   Lymphocytes Relative 21 %   Lymphs Abs 2.8 0.7 - 4.0 K/uL   Monocytes Relative 10 %   Monocytes Absolute 1.4 (H) 0.1 - 1.0 K/uL   Eosinophils Relative 1 %   Eosinophils Absolute 0.1 0.0 - 0.5 K/uL   Basophils Relative 1 %   Basophils Absolute 0.1 0.0 - 0.1 K/uL   Immature Granulocytes 1 %   Abs Immature Granulocytes 0.10 (H) 0.00 - 0.07 K/uL  Type and screen [facility]   Collection Time: 01/23/24  6:10 PM  Result Value Ref Range   ABO/RH(D) A NEG    Antibody Screen NEG    Sample Expiration      01/26/2024,2359 Performed at [facility], [address] [place], [place] [address]    CT ABDOMEN PELVIS W CONTRAST Result Date: 01/23/2024 CLINICAL DATA:  Acute abdominal pain and diarrhea for 2 days EXAM: CT ABDOMEN AND PELVIS WITH CONTRAST TECHNIQUE: Multidetector CT imaging of the abdomen and pelvis was performed using the standard protocol following bolus administration of intravenous contrast. RADIATION DOSE REDUCTION: This exam was performed according to the departmental dose-optimization program which includes automated exposure control, adjustment of the mA and/or kV according to patient size and/or use of iterative reconstruction technique. CONTRAST:  OMNIPAQUE  IOHEXOL  300 MG/ML  SOLN COMPARISON:  02/19/2004 FINDINGS: Lower chest: No acute abnormality. Hepatobiliary: Fatty  infiltration of the liver is noted. The gallbladder has been surgically removed. Pancreas: Unremarkable. No pancreatic ductal dilatation or surrounding inflammatory changes. Spleen: Normal in size without focal abnormality. Adrenals/Urinary Tract: Adrenal glands are within normal limits. Kidneys demonstrate a normal enhancement pattern bilaterally. No renal calculi or obstructive changes are seen. The ureters are within normal limits. The bladder is well distended. Stomach/Bowel: Mild circumferential thickening of the rectum is noted which may represent some focal proctitis. No perforation or abscess is seen. The sigmoid colon demonstrates scattered diverticular change without evidence of diverticulitis. The proximal colon is within normal limits. The appendix is not well visualized. Inflammatory changes to suggest appendicitis are noted. The small bowel and stomach are within normal limits. Vascular/Lymphatic: Atherosclerotic calcifications of the abdominal aorta are noted. No  aneurysmal dilatation is seen. No lymphadenopathy is noted. Reproductive: Status post hysterectomy. No adnexal masses. Other: No free fluid is noted. Musculoskeletal: Degenerative changes of lumbar spine are noted. No acute bony abnormality is seen. IMPRESSION: Circumferential wall thickening in the rectum which may represent some mild proctitis. Diverticulosis without diverticulitis. Fatty liver. Electronically Signed   By: Oneil Devonshire M.D.   On: 01/23/2024 19:30   Of note,  discussed CT read with Dr. Devonshire,  confirms typo in body of text - there is No inflammatory changes to suggest appendicitis.

## 2024-02-07 DIAGNOSIS — E876 Hypokalemia: Secondary | ICD-10-CM | POA: Diagnosis not present

## 2024-02-07 DIAGNOSIS — R197 Diarrhea, unspecified: Secondary | ICD-10-CM | POA: Diagnosis not present

## 2024-02-08 DIAGNOSIS — M48062 Spinal stenosis, lumbar region with neurogenic claudication: Secondary | ICD-10-CM | POA: Diagnosis not present

## 2024-02-22 DIAGNOSIS — M4316 Spondylolisthesis, lumbar region: Secondary | ICD-10-CM | POA: Diagnosis not present

## 2024-02-22 DIAGNOSIS — M47816 Spondylosis without myelopathy or radiculopathy, lumbar region: Secondary | ICD-10-CM | POA: Diagnosis not present

## 2024-02-22 DIAGNOSIS — M5416 Radiculopathy, lumbar region: Secondary | ICD-10-CM | POA: Diagnosis not present

## 2024-02-22 DIAGNOSIS — M48062 Spinal stenosis, lumbar region with neurogenic claudication: Secondary | ICD-10-CM | POA: Diagnosis not present

## 2024-03-08 DIAGNOSIS — M79671 Pain in right foot: Secondary | ICD-10-CM | POA: Diagnosis not present

## 2024-03-08 DIAGNOSIS — M79672 Pain in left foot: Secondary | ICD-10-CM | POA: Diagnosis not present

## 2024-03-08 DIAGNOSIS — M1460 Charcot's joint, unspecified site: Secondary | ICD-10-CM | POA: Diagnosis not present

## 2024-03-09 DIAGNOSIS — M48061 Spinal stenosis, lumbar region without neurogenic claudication: Secondary | ICD-10-CM | POA: Diagnosis not present

## 2024-03-09 DIAGNOSIS — M4807 Spinal stenosis, lumbosacral region: Secondary | ICD-10-CM | POA: Diagnosis not present

## 2024-03-09 DIAGNOSIS — M4316 Spondylolisthesis, lumbar region: Secondary | ICD-10-CM | POA: Diagnosis not present

## 2024-03-09 DIAGNOSIS — M5136 Other intervertebral disc degeneration, lumbar region with discogenic back pain only: Secondary | ICD-10-CM | POA: Diagnosis not present

## 2024-03-22 DIAGNOSIS — M5416 Radiculopathy, lumbar region: Secondary | ICD-10-CM | POA: Diagnosis not present

## 2024-03-28 DIAGNOSIS — M5416 Radiculopathy, lumbar region: Secondary | ICD-10-CM | POA: Diagnosis not present

## 2024-04-04 DIAGNOSIS — M47816 Spondylosis without myelopathy or radiculopathy, lumbar region: Secondary | ICD-10-CM | POA: Diagnosis not present

## 2024-04-04 DIAGNOSIS — M4316 Spondylolisthesis, lumbar region: Secondary | ICD-10-CM | POA: Diagnosis not present

## 2024-04-04 DIAGNOSIS — M5416 Radiculopathy, lumbar region: Secondary | ICD-10-CM | POA: Diagnosis not present

## 2024-04-04 DIAGNOSIS — M48062 Spinal stenosis, lumbar region with neurogenic claudication: Secondary | ICD-10-CM | POA: Diagnosis not present

## 2024-04-05 ENCOUNTER — Ambulatory Visit (HOSPITAL_COMMUNITY)

## 2024-04-18 DIAGNOSIS — Z23 Encounter for immunization: Secondary | ICD-10-CM | POA: Diagnosis not present

## 2024-05-09 ENCOUNTER — Other Ambulatory Visit: Payer: Self-pay | Admitting: Internal Medicine

## 2024-05-09 DIAGNOSIS — R931 Abnormal findings on diagnostic imaging of heart and coronary circulation: Secondary | ICD-10-CM

## 2024-05-09 DIAGNOSIS — E785 Hyperlipidemia, unspecified: Secondary | ICD-10-CM

## 2024-05-11 DIAGNOSIS — M48062 Spinal stenosis, lumbar region with neurogenic claudication: Secondary | ICD-10-CM | POA: Diagnosis not present

## 2024-05-22 ENCOUNTER — Other Ambulatory Visit: Payer: Self-pay

## 2024-05-22 DIAGNOSIS — E785 Hyperlipidemia, unspecified: Secondary | ICD-10-CM

## 2024-05-22 LAB — LIPID PANEL
Chol/HDL Ratio: 2.6 ratio (ref 0.0–4.4)
Cholesterol, Total: 160 mg/dL (ref 100–199)
HDL: 62 mg/dL (ref >39)
LDL Chol Calc (NIH): 71 mg/dL (ref 0–99)
Triglycerides: 158 mg/dL — ABNORMAL HIGH (ref 0–149)
VLDL Cholesterol Cal: 27 mg/dL (ref 5–40)

## 2024-05-23 ENCOUNTER — Encounter (HOSPITAL_BASED_OUTPATIENT_CLINIC_OR_DEPARTMENT_OTHER): Payer: Self-pay

## 2024-05-23 ENCOUNTER — Ambulatory Visit: Payer: Self-pay | Admitting: Internal Medicine

## 2024-05-23 ENCOUNTER — Other Ambulatory Visit: Payer: Self-pay | Admitting: Internal Medicine

## 2024-05-23 DIAGNOSIS — Z1231 Encounter for screening mammogram for malignant neoplasm of breast: Secondary | ICD-10-CM

## 2024-05-24 ENCOUNTER — Encounter (HOSPITAL_BASED_OUTPATIENT_CLINIC_OR_DEPARTMENT_OTHER): Payer: Self-pay | Admitting: Nurse Practitioner

## 2024-05-24 ENCOUNTER — Ambulatory Visit (HOSPITAL_BASED_OUTPATIENT_CLINIC_OR_DEPARTMENT_OTHER): Admitting: Nurse Practitioner

## 2024-05-24 VITALS — BP 134/70 | HR 69 | Ht 65.0 in | Wt 145.3 lb

## 2024-05-24 DIAGNOSIS — Z789 Other specified health status: Secondary | ICD-10-CM | POA: Diagnosis not present

## 2024-05-24 DIAGNOSIS — R931 Abnormal findings on diagnostic imaging of heart and coronary circulation: Secondary | ICD-10-CM

## 2024-05-24 DIAGNOSIS — R2681 Unsteadiness on feet: Secondary | ICD-10-CM

## 2024-05-24 DIAGNOSIS — E785 Hyperlipidemia, unspecified: Secondary | ICD-10-CM

## 2024-05-24 DIAGNOSIS — I2510 Atherosclerotic heart disease of native coronary artery without angina pectoris: Secondary | ICD-10-CM

## 2024-05-24 DIAGNOSIS — R030 Elevated blood-pressure reading, without diagnosis of hypertension: Secondary | ICD-10-CM

## 2024-05-24 DIAGNOSIS — G8929 Other chronic pain: Secondary | ICD-10-CM

## 2024-05-24 DIAGNOSIS — M5440 Lumbago with sciatica, unspecified side: Secondary | ICD-10-CM

## 2024-05-24 NOTE — Progress Notes (Signed)
 Cardiology Office Note   Date:  05/26/2024  ID:  [name], [name] 1941/07/04, MRN [id number] PCP: Sheryle Carwin, MD  [facility] Providers Cardiologist:  Vinie JAYSON Maxcy, MD     [facility] Dyslipidemia Coronary artery disease CT Calcium score 05/2023 CAC Score 219.7 (62nd percentile) LM 0, LAD 183, LCx 11.5, RCA 25.2 Low risk lexiscan  myoview  09/06/23 Aortic atherosclerosis Statin intolerance Hypertension Abnormal EKG  Referred to cardiology and seen by Dr. Maxcy 08/27/23 for evaluation of chest pain. Family history is unknown given that she is adopted.  CT calcium score November 2024 as noted above as well as notation of aortic atherosclerosis.  She reported chest discomfort that generally occurs at rest and does not worsen with exertion.  She had recently started on rosuvastatin with lipid panel January 2025 that showed total cholesterol 136, HDL 41, triglycerides 210, and LDL 53.  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  Lexiscan  Myoview  completed 09/06/2023 which was low risk, no evidence of ischemia, and normal LVEF.  Seen in follow-up by Dr. Maxcy on 11/26/2023 at which time she reported chest pressure had improved.  Her LDL was 161 February 2025.  She had stopped rosuvastatin because it caused fatigue.  She had a similar response to atorvastatin in the past.  She was started on Repatha  140 mg every 14 days.  Lipid panel 05/22/2024 with total cholesterol 160, triglycerides 158, HDL 62, and LDL-C 71.  History of Present Illness Discussed the use of AI scribe software for clinical note transcription with the patient, who gave verbal consent to proceed.  History of Present Illness [name] is a very pleasant 83 year old female who presents for follow-up on her cholesterol management.  She is accompanied by her husband.  Her LDL cholesterol has decreased from 161 to 71, triglycerides from 207 to 158, and total cholesterol is 160 on Repatha . She denies  any side effects from the medication. She experiences no chest pain, shortness of breath, orthopnea, PND, edema, palpitations, presyncope or syncope. She had a recent bout of GI distress felt to be exacerbated by prednisone.  She also notes difficulty with balance felt to be secondary to sciatica caused by back pain.  She is considering back surgery but is concerned about anesthesia at her age.   ROS: See HPI  Studies Reviewed       No results found for: LIPOA  Risk Assessment/Calculations       Physical Exam VS:  BP 134/70   Pulse 69   Ht 5' 5 (1.651 m)   Wt 145 lb 4.8 oz (65.9 kg)   SpO2 96%   BMI 24.18 kg/m    Wt Readings from Last 3 Encounters:  05/24/24 145 lb 4.8 oz (65.9 kg)  01/23/24 147 lb 11.3 oz (67 kg)  11/26/23 147 lb 12.8 oz (67 kg)    GEN: Well nourished, well developed in no acute distress NECK: No JVD; No carotid bruits CARDIAC: RRR, no murmurs, rubs, gallops RESPIRATORY:  Clear to auscultation without rales, wheezing or rhonchi  ABDOMEN: Soft, non-tender, non-distended EXTREMITIES:  No edema; No deformity   Assessment & Plan Hyperlipidemia LDL goal < 70 Aortic atherosclerosis Coronary artery calcification Statin intolerance Lipid panel completed 05/22/2024 with total cholesterol 160, triglycerides 158, HDL 62, LDL-C 71. LDL has improved from 161 to 71 and triglycerides have improved from 207 to 158.  She is tolerating Repatha  without any concerning side effects.  LDL goal less than 70 based on coronary  calcification and aortic atherosclerosis. She denies chest pain, dyspnea, or other symptoms concerning for angina.  No indication for further ischemic evaluation at this time.  - Continue Repatha  140 mg every 14 days  Elevated blood pressure reading BP initially mildly elevated at 144/70.  BP improved on my recheck to 134/70.  She is not on antihypertensive therapy.  She reports no known issues with elevated BP. - Monitor blood pressure at home  occasionally to ensure it remains below 140/80  Peripheral neuropathy Chronic back pain Contributing to balance issues. Considering back surgery but concerned about anesthesia risks due to age.  Advised at this point, there would be no further cardiac testing required for surgical clearance. - Consider chair exercises to maintain muscle and bone strength         Dispo: 1 year with Dr. Mona or me  Signed, Rosaline Bane, NP-C

## 2024-05-24 NOTE — Patient Instructions (Addendum)
 Medication Instructions:  Your physician recommends that you continue on your current medications as directed. Please refer to the Current Medication list given to you today.  *If you need a refill on your cardiac medications before your next appointment, please call your pharmacy*  Lab Work: Your physician recommends that you return for lab work in: FASTING LIPID IN 1 YEAR ABOUT A WEEK PRIOR TO FOLLOW UP   Lipid panel  If you have labs (blood work) drawn today and your tests are completely normal, you will receive your results only by: MyChart Message (if you have MyChart) OR A paper copy in the mail If you have any lab test that is abnormal or we need to change your treatment, we will call you to review the results.  Testing/Procedures: NONE  Follow-Up: At [facility], you and your health needs are our priority.  As part of our continuing mission to provide you with exceptional heart care, our providers are all part of one team.  This team includes your primary Cardiologist (physician) and Advanced Practice Providers or APPs (Physician Assistants and Nurse Practitioners) who all work together to provide you with the care you need, when you need it.  Your next appointment:   12 month(s)  Provider:   Rosaline Bane, NP in [facility]  We recommend signing up for the patient portal called MyChart.  Sign up information is provided on this After Visit Summary.  MyChart is used to connect with patients for Virtual Visits (Telemedicine).  Patients are able to view lab/test results, encounter notes, upcoming appointments, etc.  Non-urgent messages can be sent to your provider as well.   To learn more about what you can do with MyChart, go to [email or web address].

## 2024-05-26 ENCOUNTER — Encounter (HOSPITAL_BASED_OUTPATIENT_CLINIC_OR_DEPARTMENT_OTHER): Payer: Self-pay | Admitting: Nurse Practitioner

## 2024-06-01 ENCOUNTER — Ambulatory Visit: Admitting: Orthopedic Surgery

## 2024-06-16 ENCOUNTER — Ambulatory Visit
Admission: RE | Admit: 2024-06-16 | Discharge: 2024-06-16 | Disposition: A | Source: Ambulatory Visit | Attending: Internal Medicine | Admitting: Internal Medicine

## 2024-06-16 DIAGNOSIS — Z1231 Encounter for screening mammogram for malignant neoplasm of breast: Secondary | ICD-10-CM | POA: Diagnosis not present

## 2024-07-18 ENCOUNTER — Ambulatory Visit: Admitting: Orthopedic Surgery

## 2024-07-18 ENCOUNTER — Other Ambulatory Visit (INDEPENDENT_AMBULATORY_CARE_PROVIDER_SITE_OTHER): Payer: Self-pay

## 2024-07-18 VITALS — BP 137/79 | HR 73 | Ht 65.0 in | Wt 146.0 lb

## 2024-07-18 DIAGNOSIS — M545 Low back pain, unspecified: Secondary | ICD-10-CM

## 2024-07-18 NOTE — Progress Notes (Signed)
 Orthopedic Spine Surgery Office Note  Assessment: Patient is a 84 y.o. female with chronic low back pain that has gotten worse and now radiates into the left lower extremity.  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  Her pain is severe enough that she wakes at night in pain and has difficulty sleeping.  I talked about her options for treatment.  Based on her descriptions, it sounds like she has gotten facet injections and possibly ablations.  For her leg pain, she could try a transforaminal injection.  After that, her remaining options would be surgical or pain management.  She had previously seen another surgeon who also discussed surgery as an option.  She wanted a second opinion because she had concerns about her age and surgery.  I did tell her that her risks would be higher with surgery given her age but her pain sounds severe enough that the potential benefits of surgery outweigh the risks -After conversation, patient is going to return to the office she had seen initially and try potentially an injection but then seemed like she was leaning toward surgery if that did not provide her with lasting relief  Patient expressed understanding of the plan and all questions were answered to the patient's satisfaction.   ___________________________________________________________________________   History:  Patient is a 84 y.o. female who presents today for a second opinion on her lumbar spine.  Patient has had several years of low back pain.  She had previously been getting injections that have been giving her good relief for her back pain.  Her pain has gotten progressively worse though and she has now developed radiating left leg pain.  She feels it along the anterior aspect of the thigh and leg.  Goes all the way to the level of the ankle.  She notes  difficulty sleeping at night as a result of the pain.  She rates the pain as a 9 out of 10 at its worst.  There was no trauma or injury that preceded the onset of the pain.  She has tried several conservative treatments over the years but her pain is getting worse with time.  Reports difficulty with ambulation due to pain.  No bowel or bladder incontinence.  No saddle anesthesia.  Treatments tried: Tylenol , ibuprofen, PT, steroid injections, Lyrica   Review of systems: Denies fevers and chills, night sweats, unexplained weight loss, history of cancer, pain that wakes them at night  Past medical history: HLD HTN IBS History of stroke Neuropathy Charcot-Marie-Tooth Brain tumors  Allergies: statins, prednisone, ciprofloxacin, sulfa  Past surgical history:  Hysterectomy Cholecystectomy Cataract surgery 2 foot surgeries Breast lumpectomy  Social history: Denies use of nicotine product (smoking, vaping, patches, smokeless) Alcohol use: Denies Denies recreational drug use   Physical Exam:  BMI of 24.3  General: no acute distress, appears stated age Neurologic: alert, answering questions appropriately, following commands Respiratory: unlabored breathing on room air, symmetric chest rise Psychiatric: appropriate affect, normal cadence to speech   MSK (spine): Ambulating without assistive devices, able to do sit to stand, ambulating without antalgic gait  Imaging: XRs of the lumbar spine from 07/18/2024 were independently reviewed and interpreted, showing spondylolisthesis at L4/5.  Disc height loss at L3/4, L4/5, L5/S1.  There is anterior osteophyte formation seen at those levels as well.  No fracture or dislocation seen.  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  Reduction in the spondylolisthesis at L4/5 that was seen on the standing films.  Central stenosis at L3/4.  Lateral recess stenosis on the left at L4/5.   Facet hypertrophy at L3/4, L4/5.  Foraminal stenosis bilaterally at L4/5 (L>R) and L5/S1.   Patient name: [name] Patient MRN: [id number] Date of visit: 07/18/2024
